# Patient Record
Sex: Male | Born: 1945 | Race: White | Hispanic: No | Smoking: Former smoker
Health system: Southern US, Community
[De-identification: ages and names within clinical notes are randomized; demographics above are authoritative.]

## PROBLEM LIST (undated history)

## (undated) DIAGNOSIS — R351 Nocturia: Secondary | ICD-10-CM

## (undated) DIAGNOSIS — L409 Psoriasis, unspecified: Secondary | ICD-10-CM

## (undated) DIAGNOSIS — F1010 Alcohol abuse, uncomplicated: Secondary | ICD-10-CM

## (undated) DIAGNOSIS — Z860100 Personal history of colon polyps, unspecified: Secondary | ICD-10-CM

## (undated) DIAGNOSIS — G4700 Insomnia, unspecified: Secondary | ICD-10-CM

## (undated) DIAGNOSIS — C4491 Basal cell carcinoma of skin, unspecified: Secondary | ICD-10-CM

## (undated) DIAGNOSIS — K760 Fatty (change of) liver, not elsewhere classified: Secondary | ICD-10-CM

## (undated) DIAGNOSIS — K8020 Calculus of gallbladder without cholecystitis without obstruction: Secondary | ICD-10-CM

## (undated) DIAGNOSIS — F329 Major depressive disorder, single episode, unspecified: Secondary | ICD-10-CM

## (undated) DIAGNOSIS — F32A Depression, unspecified: Secondary | ICD-10-CM

## (undated) DIAGNOSIS — G4730 Sleep apnea, unspecified: Secondary | ICD-10-CM

## (undated) DIAGNOSIS — G259 Extrapyramidal and movement disorder, unspecified: Secondary | ICD-10-CM

## (undated) DIAGNOSIS — F419 Anxiety disorder, unspecified: Secondary | ICD-10-CM

## (undated) HISTORY — PX: COLONOSCOPY: SHX174

## (undated) HISTORY — DX: Fatty (change of) liver, not elsewhere classified: K76.0

## (undated) HISTORY — DX: Personal history of colonic polyps: Z86.010

## (undated) HISTORY — DX: Anxiety disorder, unspecified: F41.9

## (undated) HISTORY — PX: OTHER SURGICAL HISTORY: SHX169

## (undated) HISTORY — PX: WISDOM TOOTH EXTRACTION: SHX21

## (undated) HISTORY — DX: Basal cell carcinoma of skin, unspecified: C44.91

## (undated) HISTORY — DX: Alcohol abuse, uncomplicated: F10.10

## (undated) HISTORY — DX: Extrapyramidal and movement disorder, unspecified: G25.9

## (undated) HISTORY — DX: Personal history of colon polyps, unspecified: Z86.0100

---

## 2003-09-18 ENCOUNTER — Ambulatory Visit (HOSPITAL_COMMUNITY): Admission: RE | Admit: 2003-09-18 | Discharge: 2003-09-18 | Payer: Self-pay | Admitting: *Deleted

## 2003-09-18 ENCOUNTER — Encounter (INDEPENDENT_AMBULATORY_CARE_PROVIDER_SITE_OTHER): Payer: Self-pay | Admitting: Specialist

## 2007-09-13 ENCOUNTER — Other Ambulatory Visit (HOSPITAL_COMMUNITY): Admission: RE | Admit: 2007-09-13 | Discharge: 2007-12-12 | Payer: Self-pay | Admitting: Psychiatry

## 2007-09-13 ENCOUNTER — Ambulatory Visit: Payer: Self-pay | Admitting: Psychiatry

## 2011-02-27 NOTE — Op Note (Signed)
[name], Dennis Woodard NO.:  [id number]   MEDICAL RECORD NO.:  [id number]                   PATIENT TYPE:  AMB   LOCATION:  ENDO                                 FACILITY:  [facility]   PHYSICIAN:  Georgiana Spinner, M.D.                 DATE OF BIRTH:  1946/02/11   DATE OF PROCEDURE:  09/18/2003  DATE OF DISCHARGE:                                 OPERATIVE REPORT   PROCEDURE:  Colonoscopy.   INDICATIONS:  Colon polyp.   ANESTHESIA:  Demerol 100, Versed 10 mg.   PROCEDURE:  With the patient mildly sedated in the left lateral decubitus  position, the Olympus videoscopic colonoscope was inserted in the rectum and  passed under direct vision into the cecum, identified by ileocecal valve and  base of the cecum, both of which were photographed.  From this point, the  colonoscope was slowly withdrawn, taking several antral views of the colonic  mucosa, stopping only at 25 cm from the anal verge, at which point a small  protuberance, which could have been a polyp or could have been a lipoma, was  photographed and biopsied and removed.  I favor lipoma since there was some  whitish material exuded after we biopsied it, indicating it was probably  fatty material.  The endoscope was then withdrawn all the way to the rectum  which appeared normal on direct and retroflexed view.  The endoscope was  straightened and withdrawn.  The patient's vital signs and pulse oximeter  remained stable.  The patient tolerated the procedure well without apparent  complications.   FINDINGS:  Question of polyp at 25 cm.  Question this being a lipoma.   PLAN:  Await biopsy report.  Patient will call me for results.                                               Georgiana Spinner, M.D.    GMO/MEDQ  D:  09/18/2003  T:  09/18/2003  Job:  [id number]

## 2012-05-06 ENCOUNTER — Other Ambulatory Visit: Payer: Self-pay | Admitting: Dermatology

## 2012-08-04 ENCOUNTER — Emergency Department (HOSPITAL_COMMUNITY)
Admission: EM | Admit: 2012-08-04 | Discharge: 2012-08-04 | Disposition: A | Discharge status: Home / Self Care | Payer: BC Managed Care – PPO | Attending: Emergency Medicine | Admitting: Emergency Medicine

## 2012-08-04 ENCOUNTER — Encounter (HOSPITAL_COMMUNITY): Payer: Self-pay | Admitting: Emergency Medicine

## 2012-08-04 ENCOUNTER — Emergency Department (HOSPITAL_COMMUNITY): Payer: BC Managed Care – PPO

## 2012-08-04 DIAGNOSIS — K5289 Other specified noninfective gastroenteritis and colitis: Secondary | ICD-10-CM | POA: Insufficient documentation

## 2012-08-04 DIAGNOSIS — K529 Noninfective gastroenteritis and colitis, unspecified: Secondary | ICD-10-CM

## 2012-08-04 DIAGNOSIS — R109 Unspecified abdominal pain: Secondary | ICD-10-CM

## 2012-08-04 DIAGNOSIS — F329 Major depressive disorder, single episode, unspecified: Secondary | ICD-10-CM | POA: Insufficient documentation

## 2012-08-04 DIAGNOSIS — F3289 Other specified depressive episodes: Secondary | ICD-10-CM | POA: Insufficient documentation

## 2012-08-04 DIAGNOSIS — Z79899 Other long term (current) drug therapy: Secondary | ICD-10-CM | POA: Insufficient documentation

## 2012-08-04 DIAGNOSIS — R1011 Right upper quadrant pain: Secondary | ICD-10-CM | POA: Insufficient documentation

## 2012-08-04 HISTORY — DX: Major depressive disorder, single episode, unspecified: F32.9

## 2012-08-04 HISTORY — DX: Depression, unspecified: F32.A

## 2012-08-04 LAB — COMPREHENSIVE METABOLIC PANEL
ALT: 24 U/L (ref 0–53)
AST: 20 U/L (ref 0–37)
Albumin: 3.6 g/dL (ref 3.5–5.2)
Alkaline Phosphatase: 91 U/L (ref 39–117)
Potassium: 3.7 mEq/L (ref 3.5–5.1)
Sodium: 137 mEq/L (ref 135–145)
Total Protein: 7.2 g/dL (ref 6.0–8.3)

## 2012-08-04 LAB — URINALYSIS, ROUTINE W REFLEX MICROSCOPIC
Bilirubin Urine: NEGATIVE
Hgb urine dipstick: NEGATIVE
Protein, ur: NEGATIVE mg/dL
Urobilinogen, UA: 0.2 mg/dL (ref 0.0–1.0)

## 2012-08-04 LAB — CBC WITH DIFFERENTIAL/PLATELET
Basophils Relative: 0 % (ref 0–1)
Eosinophils Absolute: 0.2 10*3/uL (ref 0.0–0.7)
MCH: 30.4 pg (ref 26.0–34.0)
MCHC: 34.4 g/dL (ref 30.0–36.0)
Neutrophils Relative %: 84 % — ABNORMAL HIGH (ref 43–77)
Platelets: 203 10*3/uL (ref 150–400)
RBC: 4.27 MIL/uL (ref 4.22–5.81)

## 2012-08-04 MED ORDER — METRONIDAZOLE IN NACL 5-0.79 MG/ML-% IV SOLN
500.0000 mg | Freq: Once | INTRAVENOUS | Status: AC
  Administered 2012-08-04: 500 mg via INTRAVENOUS
  Filled 2012-08-04: qty 100

## 2012-08-04 MED ORDER — SODIUM CHLORIDE 0.9 % IV BOLUS (SEPSIS)
500.0000 mL | Freq: Once | INTRAVENOUS | Status: AC
  Administered 2012-08-04: 500 mL via INTRAVENOUS

## 2012-08-04 MED ORDER — IOHEXOL 300 MG/ML  SOLN: 20.0000 mL | INTRAMUSCULAR | Status: AC

## 2012-08-04 MED ORDER — METRONIDAZOLE 500 MG PO TABS: 500.0000 mg | ORAL_TABLET | Freq: Two times a day (BID) | ORAL | Status: DC

## 2012-08-04 MED ORDER — HYDROMORPHONE HCL PF 1 MG/ML IJ SOLN
0.5000 mg | Freq: Once | INTRAMUSCULAR | Status: AC
  Administered 2012-08-04: 0.5 mg via INTRAVENOUS
  Filled 2012-08-04: qty 1

## 2012-08-04 MED ORDER — HYDROCODONE-ACETAMINOPHEN 5-500 MG PO TABS: 1.0000 | ORAL_TABLET | Freq: Four times a day (QID) | ORAL | Status: DC | PRN

## 2012-08-04 MED ORDER — CIPROFLOXACIN IN D5W 400 MG/200ML IV SOLN
400.0000 mg | Freq: Once | INTRAVENOUS | Status: AC
  Administered 2012-08-04: 400 mg via INTRAVENOUS
  Filled 2012-08-04: qty 200

## 2012-08-04 MED ORDER — IOHEXOL 300 MG/ML  SOLN
80.0000 mL | Freq: Once | INTRAMUSCULAR | Status: AC | PRN
  Administered 2012-08-04: 80 mL via INTRAVENOUS

## 2012-08-04 MED ORDER — ONDANSETRON HCL 4 MG/2ML IJ SOLN
4.0000 mg | Freq: Once | INTRAMUSCULAR | Status: AC
  Administered 2012-08-04: 4 mg via INTRAVENOUS
  Filled 2012-08-04: qty 2

## 2012-08-04 MED ORDER — SODIUM CHLORIDE 0.9 % IV SOLN
INTRAVENOUS | Status: DC
  Administered 2012-08-04: 09:00:00 via INTRAVENOUS

## 2012-08-04 MED ORDER — HYDROMORPHONE HCL PF 1 MG/ML IJ SOLN
1.0000 mg | Freq: Once | INTRAMUSCULAR | Status: AC
  Administered 2012-08-04: 1 mg via INTRAVENOUS
  Filled 2012-08-04: qty 1

## 2012-08-04 MED ORDER — CIPROFLOXACIN HCL 500 MG PO TABS: 500.0000 mg | ORAL_TABLET | Freq: Two times a day (BID) | ORAL | Status: DC

## 2012-08-04 NOTE — ED Provider Notes (Signed)
Assumed pt care in CDU.  Pt presents with abd pain, CT scan ordered for further evaluation.  CT shows evidence of colitis in ascending colon.  Pt currently in NAD, VSS, afebrile.  On examination he appeared in good health and spirits. Vital signs as documented. Skin warm and dry and without overt rashes. Neck without JVD. Lungs clear. Heart exam notable for regular rhythm, normal sounds and absence of murmurs, rubs or gallops. Abdomen moderately tender to RLQ without guarding or rebound tenderness. No evidence of organomegaly, masses, or abdominal aortic enlargement. Extremities nonedematous. Will give abx here and will d/c with cipro/flagyl and f/u with his PCP, Dr. Ricki Miller  3:09 PM Pt remains comfortable, continue to receive IV abx.  VSS, afebrile.  Will d/c with cipro/flagyl once IV is complete.  Report given to oncoming PA who will d/c pt once abx is finished.  BP 119/66  Pulse 63  Temp 97.9 F (36.6 C) (Oral)  Resp 16  SpO2 95%  I have reviewed nursing notes and vital signs. I personally reviewed the imaging tests through PACS system  I reviewed available ER/hospitalization records thought the EMR  Results for orders placed during the hospital encounter of 08/04/12  CBC WITH DIFFERENTIAL      Component Value Range   WBC 10.9 (*) 4.0 - 10.5 K/uL   RBC 4.27  4.22 - 5.81 MIL/uL   Hemoglobin 13.0  13.0 - 17.0 g/dL   HCT 16.1 (*) 09.6 - 04.5 %   MCV 88.5  78.0 - 100.0 fL   MCH 30.4  26.0 - 34.0 pg   MCHC 34.4  30.0 - 36.0 g/dL   RDW 40.9  81.1 - 91.4 %   Platelets 203  150 - 400 K/uL   Neutrophils Relative 84 (*) 43 - 77 %   Neutro Abs 9.1 (*) 1.7 - 7.7 K/uL   Lymphocytes Relative 8 (*) 12 - 46 %   Lymphs Abs 0.9  0.7 - 4.0 K/uL   Monocytes Relative 7  3 - 12 %   Monocytes Absolute 0.8  0.1 - 1.0 K/uL   Eosinophils Relative 1  0 - 5 %   Eosinophils Absolute 0.2  0.0 - 0.7 K/uL   Basophils Relative 0  0 - 1 %   Basophils Absolute 0.0  0.0 - 0.1 K/uL  COMPREHENSIVE METABOLIC PANEL      Component Value Range   Sodium 137  135 - 145 mEq/L   Potassium 3.7  3.5 - 5.1 mEq/L   Chloride 100  96 - 112 mEq/L   CO2 26  19 - 32 mEq/L   Glucose, Bld 124 (*) 70 - 99 mg/dL   BUN 7  6 - 23 mg/dL   Creatinine, Ser 7.82  0.50 - 1.35 mg/dL   Calcium 9.0  8.4 - 95.6 mg/dL   Total Protein 7.2  6.0 - 8.3 g/dL   Albumin 3.6  3.5 - 5.2 g/dL   AST 20  0 - 37 U/L   ALT 24  0 - 53 U/L   Alkaline Phosphatase 91  39 - 117 U/L   Total Bilirubin 0.3  0.3 - 1.2 mg/dL   GFR calc non Af Amer >90  >90 mL/min   GFR calc Af Amer >90  >90 mL/min  LIPASE, BLOOD      Component Value Range   Lipase 29  11 - 59 U/L  URINALYSIS, ROUTINE W REFLEX MICROSCOPIC      Component Value Range   Color,  Urine YELLOW  YELLOW   APPearance CLEAR  CLEAR   Specific Gravity, Urine 1.021  1.005 - 1.030   pH 5.0  5.0 - 8.0   Glucose, UA NEGATIVE  NEGATIVE mg/dL   Hgb urine dipstick NEGATIVE  NEGATIVE   Bilirubin Urine NEGATIVE  NEGATIVE   Ketones, ur NEGATIVE  NEGATIVE mg/dL   Protein, ur NEGATIVE  NEGATIVE mg/dL   Urobilinogen, UA 0.2  0.0 - 1.0 mg/dL   Nitrite NEGATIVE  NEGATIVE   Leukocytes, UA NEGATIVE  NEGATIVE   Ct Abdomen Pelvis W Contrast  08/04/2012  *RADIOLOGY REPORT*  Clinical Data: Right-sided abdominal pain for several weeks.  No fever or chills.  CT ABDOMEN AND PELVIS WITH CONTRAST  Technique:  Multidetector CT imaging of the abdomen and pelvis was performed following the standard protocol during bolus administration of intravenous contrast.  Contrast: 80mL OMNIPAQUE IOHEXOL 300 MG/ML  SOLN 80 ml  Comparison: None.  Findings: The lung bases are clear except for atelectatic changes at the right base.  There is no pleural effusion.  There is no pericardial effusion.  There is mild fatty infiltration of the liver without focal changes.  The gallbladder and the biliary tree appear normal.  The spleen and pancreas are normal.  The aorta and inferior vena cava are normal.  There is no evidence of  lymphadenopathy, free air, fluid collections or free fluid.  The adrenal glands and kidneys and retroperitoneum are normal. There are no stones or masses.  Stomach, duodenum and small bowel are normal.  The terminal ileum and the appendix are normal.  The cecum is normal.  The lumen of the ascending colon is collapsed but there is a suggestion of some injection into the mesenteric fat surrounding the ascending colon from about the upper margin of the cecum to the hepatic flexure.  Some of this strand-like density extends across to the duodenum which is otherwise normal.  The remainder of the colon is normal.  The prostate is slightly prominent.  The urinary bladder is normal. The pelvic sidewalls are normal.  There are no inguinal, abdominal wall or musculoskeletal abnormalities.  Bone windows show no osteolytic or osteoblastic bone disease.  IMPRESSION: Colitis of the ascending colon.  No additional acute pathologic findings.  Fatty liver.   Original Report Authenticated By: Mervin Hack, M.D.       Fayrene Helper, PA-C 08/04/12 1527

## 2012-08-04 NOTE — ED Notes (Signed)
MD at bedside. Laveda Norman)

## 2012-08-04 NOTE — ED Notes (Signed)
PT. REPORTS RIGHT ABDOMINAL PAIN FOR SEVERAL WEEKS , WORSE THIS MORNING WITH DIZZINESS , DENIES NAUSEA,VOMITTING OR DIARRHEA, NO FEVER OR CHILLS.

## 2012-08-04 NOTE — ED Provider Notes (Signed)
History     CSN: [id number]  Arrival date & time 08/04/12  0620   First MD Initiated Contact with Patient 08/04/12 [phone]      Chief Complaint  Patient presents with  . Abdominal Pain    (Consider location/radiation/quality/duration/timing/severity/associated sxs/prior treatment) HPI Comments: Patients with no significant past medical history, normal colonoscopy 9 years ago -- presents with complaint of intermittent right-sided sharp stabbing abdominal pain for the past 3-4 weeks. Pain will radiate to his right middle back. Patient states that the pain was worse this morning. No associated fevers, shortness of breath, chest pain, nausea, vomiting, diarrhea, urinary symptoms. No treatments prior to arrival. Patient states that he was a heavy drinker for approximately 14 years, he has been sober since 61. No history of abdominal surgeries. No tobacco. Patient has recently been seen by his primary care physician and had blood tests done. He states all appeared normal except for elevated liver function. He has not had any imaging for this problem. Onset acute. Course is gradually worsening. Palpation makes the pain worse. Nothing makes it better.  Patient is a 66 y.o. male presenting with abdominal pain. The history is provided by the patient.  Abdominal Pain The primary symptoms of the illness include abdominal pain. The primary symptoms of the illness do not include fever, shortness of breath, nausea, vomiting, diarrhea, hematochezia or dysuria. The current episode started 3 to 5 hours ago. The onset of the illness was gradual. The problem has not changed since onset. Significant associated medical issues do not include PUD, gallstones, liver disease or diverticulitis.    Past Medical History  Diagnosis Date  . Depression     History reviewed. No pertinent past surgical history.  No family history on file.  History  Substance Use Topics  . Smoking status: Never Smoker   . Smokeless  tobacco: Not on file  . Alcohol Use: No      Review of Systems  Constitutional: Negative for fever, appetite change and unexpected weight change.  HENT: Negative for sore throat and rhinorrhea.   Eyes: Negative for redness.  Respiratory: Negative for cough and shortness of breath.   Cardiovascular: Negative for chest pain.  Gastrointestinal: Positive for abdominal pain. Negative for nausea, vomiting, diarrhea, blood in stool and hematochezia.  Genitourinary: Negative for dysuria.  Musculoskeletal: Negative for myalgias.  Skin: Negative for rash.  Neurological: Negative for headaches.    Allergies  Review of patient's allergies indicates no known allergies.  Home Medications   Current Outpatient Rx  Name Route Sig Dispense Refill  . BUPROPION HCL ER (XL) 150 MG PO TB24 Oral Take 450 mg by mouth daily.    Marland Kitchen CLONAZEPAM 2 MG PO TABS Oral Take 2 mg by mouth 3 (three) times daily. Facial twitch Scheduled doses    . ESCITALOPRAM OXALATE 10 MG PO TABS Oral Take 10 mg by mouth daily.    . ALEVE PO Oral Take 1 tablet by mouth every 12 (twelve) hours as needed. Chest wall/ rib pain    . TRAZODONE HCL 50 MG PO TABS Oral Take 50-200 mg by mouth at bedtime as needed. For sleep Usually takes 2 tabs, can take up to 4 tabs if needed    . CIPROFLOXACIN HCL 500 MG PO TABS Oral Take 1 tablet (500 mg total) by mouth 2 (two) times daily. 28 tablet 0  . HYDROCODONE-ACETAMINOPHEN 5-500 MG PO TABS Oral Take 1-2 tablets by mouth every 6 (six) hours as needed for pain. 15 tablet  0  . METRONIDAZOLE 500 MG PO TABS Oral Take 1 tablet (500 mg total) by mouth 2 (two) times daily. 28 tablet 0    BP 191/83  Pulse 63  Temp 99.1 F (37.3 C) (Oral)  Resp 16  SpO2 98%  Physical Exam  Nursing note and vitals reviewed. Constitutional: He appears well-developed and well-nourished.  HENT:  Head: Normocephalic and atraumatic.  Eyes: Conjunctivae normal are normal. Right eye exhibits no discharge. Left eye  exhibits no discharge.  Neck: Normal range of motion. Neck supple.  Cardiovascular: Normal rate, regular rhythm and normal heart sounds.   Pulmonary/Chest: Effort normal and breath sounds normal.  Abdominal: Soft. He exhibits no distension, no pulsatile midline mass and no mass. Bowel sounds are decreased. There is no tenderness (neg Rovsings sign). There is no rebound and no guarding. No hernia.    Neurological: He is alert.  Skin: Skin is warm and dry.  Psychiatric: He has a normal mood and affect.    ED Course  Procedures (including critical care time)  Labs Reviewed  CBC WITH DIFFERENTIAL - Abnormal; Notable for the following:    WBC 10.9 (*)     HCT 37.8 (*)     Neutrophils Relative 84 (*)     Neutro Abs 9.1 (*)     Lymphocytes Relative 8 (*)     All other components within normal limits  COMPREHENSIVE METABOLIC PANEL - Abnormal; Notable for the following:    Glucose, Bld 124 (*)     All other components within normal limits  LIPASE, BLOOD  URINALYSIS, ROUTINE W REFLEX MICROSCOPIC   Ct Abdomen Pelvis W Contrast  08/04/2012  *RADIOLOGY REPORT*  Clinical Data: Right-sided abdominal pain for several weeks.  No fever or chills.  CT ABDOMEN AND PELVIS WITH CONTRAST  Technique:  Multidetector CT imaging of the abdomen and pelvis was performed following the standard protocol during bolus administration of intravenous contrast.  Contrast: 80mL OMNIPAQUE IOHEXOL 300 MG/ML  SOLN 80 ml  Comparison: None.  Findings: The lung bases are clear except for atelectatic changes at the right base.  There is no pleural effusion.  There is no pericardial effusion.  There is mild fatty infiltration of the liver without focal changes.  The gallbladder and the biliary tree appear normal.  The spleen and pancreas are normal.  The aorta and inferior vena cava are normal.  There is no evidence of lymphadenopathy, free air, fluid collections or free fluid.  The adrenal glands and kidneys and retroperitoneum  are normal. There are no stones or masses.  Stomach, duodenum and small bowel are normal.  The terminal ileum and the appendix are normal.  The cecum is normal.  The lumen of the ascending colon is collapsed but there is a suggestion of some injection into the mesenteric fat surrounding the ascending colon from about the upper margin of the cecum to the hepatic flexure.  Some of this strand-like density extends across to the duodenum which is otherwise normal.  The remainder of the colon is normal.  The prostate is slightly prominent.  The urinary bladder is normal. The pelvic sidewalls are normal.  There are no inguinal, abdominal wall or musculoskeletal abnormalities.  Bone windows show no osteolytic or osteoblastic bone disease.  IMPRESSION: Colitis of the ascending colon.  No additional acute pathologic findings.  Fatty liver.   Original Report Authenticated By: Mervin Hack, M.D.      1. Abdominal pain   2. Colitis  6:49 AM Patient seen and examined. Work-up initiated. Medications ordered.   Vital signs reviewed and are as follows: Filed Vitals:   08/04/12 0623  BP: 191/83  Pulse: 63  Temp: 99.1 F (37.3 C)  Resp: 16   8:13 AM Re-exam unchanged. BP improved from earlier. Pain from 10/10 to 5/10. Will move to CDU pending CT abd/pelvis. Care d/w Ardelle Park who will follow.   Plan: dispo per CT. Pain is lower than what would be expected for GB etiology however this remains on ddx.   BP 151/68  Pulse 63  Temp 99.1 F (37.3 C) (Oral)  Resp 17  SpO2 97%  MDM  CDU pending CT. DDx: colitis, appendicitis, nephrolithiasis, cholelithiasis        Renne Crigler, [place] 08/04/12 1644

## 2012-08-04 NOTE — ED Provider Notes (Signed)
Medical screening examination/treatment/procedure(s) were performed by non-physician practitioner and as supervising physician I was immediately available for consultation/collaboration.  Marwan T Powers, MD 08/04/12 1721 

## 2012-08-04 NOTE — ED Notes (Signed)
Pt reports that liver enzymes were elevated in blood labs drawn by PCP

## 2012-08-04 NOTE — ED Provider Notes (Signed)
Medical screening examination/treatment/procedure(s) were conducted as a shared visit with non-physician practitioner(s) and myself.  I personally evaluated the patient during the encounter  Intermittent RUQ pain for the past month. No vomiting or change in bowel habits.  Pain radiates to back. Previous alcoholic.  No neuro or pulse deficits. No pulsatile mass.  Glynn Octave, MD 08/04/12 [phone]

## 2012-08-04 NOTE — ED Notes (Addendum)
Drinking oral contrast for CT scan. No c/o nausea presently

## 2012-08-04 NOTE — ED Notes (Signed)
Report received, assumed care.  

## 2012-08-17 ENCOUNTER — Encounter: Payer: Self-pay | Admitting: Gastroenterology

## 2012-09-14 ENCOUNTER — Telehealth: Payer: Self-pay | Admitting: Gastroenterology

## 2012-09-14 ENCOUNTER — Ambulatory Visit (INDEPENDENT_AMBULATORY_CARE_PROVIDER_SITE_OTHER): Payer: BC Managed Care – PPO | Admitting: Gastroenterology

## 2012-09-14 ENCOUNTER — Encounter: Payer: Self-pay | Admitting: Gastroenterology

## 2012-09-14 VITALS — BP 120/68 | HR 68 | Ht 67.0 in | Wt 196.0 lb

## 2012-09-14 DIAGNOSIS — R933 Abnormal findings on diagnostic imaging of other parts of digestive tract: Secondary | ICD-10-CM

## 2012-09-14 MED ORDER — PEG-KCL-NACL-NASULF-NA ASC-C 100 G PO SOLR: 1.0000 | Freq: Once | ORAL | Status: DC

## 2012-09-14 NOTE — Telephone Encounter (Signed)
Left message on machine to call back  

## 2012-09-14 NOTE — Patient Instructions (Addendum)
You will be set up for a colonoscopy (LEC, moderate sedation) for recent abd pains, abnormal colon on CT. Before scheduling we will have our insurance expert to determine your out of pocket expense.

## 2012-09-14 NOTE — Telephone Encounter (Signed)
Pt questions were answered and he will call back with any further problems or concerns

## 2012-09-14 NOTE — Progress Notes (Signed)
HPI: This is a    very pleasant  66 year old man whom I am meeting for the first time today.  Was having right abd pains.  Intermittent.  Woke him up at night.  Crampy like pain, no associated nausea or vomiting.   Would go away after 4 hours.  No diarrhea until after he left the hospital.  Very loose stools for 4-5 days.  For 2 months, no problems with bowels, bleed.  CT scan abdomen and pelvis with IV and oral contrast 2 months ago suggested ascending colon inflammation. Labs at the time showed he was not anemic, cmet was also normal. Was put on abx.     Review of systems: Pertinent positive and negative review of systems were noted in the above HPI section. Complete review of systems was performed and was otherwise normal.    Past Medical History  Diagnosis Date  . Depression   . Colitis   . Fatty liver   . History of colon polyps   . Alcohol abuse   . Anxiety   . Skin cancer, basal cell     Past Surgical History  Procedure Date  . Wisdom tooth extraction     Current Outpatient Prescriptions  Medication Sig Dispense Refill  . buPROPion (WELLBUTRIN XL) 150 MG 24 hr tablet Take 150 mg by mouth daily.       . clonazePAM (KLONOPIN) 2 MG tablet Take 2 mg by mouth 3 (three) times daily. Facial twitch Scheduled doses      . escitalopram (LEXAPRO) 10 MG tablet Take 10 mg by mouth daily.      . Naproxen Sodium (ALEVE PO) Take 1 tablet by mouth every 12 (twelve) hours as needed. Chest wall/ rib pain      . traZODone (DESYREL) 50 MG tablet Take 50-200 mg by mouth at bedtime as needed. For sleep Usually takes 2 tabs, can take up to 4 tabs if needed        Allergies as of 09/14/2012  . (No Known Allergies)    History reviewed. No pertinent family history.  History   Social History  . Marital Status: Single    Spouse Name: N/A    Number of Children: 4  . Years of Education: N/A   Occupational History  . Bus Driver [employer]   Social History Main  Topics  . Smoking status: Never Smoker   . Smokeless tobacco: Never Used  . Alcohol Use: No  . Drug Use: No  . Sexually Active: Not on file   Other Topics Concern  . Not on file   Social History Narrative  . No narrative on file       Physical Exam: BP 120/68  Pulse 68  Ht 5\' 7"  (1.702 m)  Wt 196 lb (88.905 kg)  BMI 30.70 kg/m2 Constitutional: generally well-appearing Psychiatric: alert and oriented x3 Eyes: extraocular movements intact Mouth: oral pharynx moist, no lesions Neck: supple no lymphadenopathy Cardiovascular: heart regular rate and rhythm Lungs: clear to auscultation bilaterally Abdomen: soft, nontender, nondistended, no obvious ascites, no peritoneal signs, normal bowel sounds Extremities: no lower extremity edema bilaterally Skin: no lesions on visible extremities    Assessment and plan: 66 y.o. male with  recent right-sided abdominal pain, inflammation in a descending colon on CT scan, symptoms have completely improved  I did not mention above but he did have a colonoscopy December 2004 with Dr. Sabino Gasser, this found a small possible polyp at 25 cm. Biopsies showed no adenomatous  change.  His symptoms have completely improved since his October 2013 right-sided abdominal pain. I suspect he had some sort of infectious colitis. I suggested colonoscopy since it has been 9 years since his last one and he had that acute event recently. He is not sure he wants to go ahead with it however. He does understand that he would be recommended to have a routine screening colonoscopy within the next year.

## 2012-10-03 ENCOUNTER — Encounter: Payer: Self-pay | Admitting: Gastroenterology

## 2012-10-03 ENCOUNTER — Telehealth: Payer: Self-pay

## 2012-10-03 ENCOUNTER — Ambulatory Visit (AMBULATORY_SURGERY_CENTER): Payer: BC Managed Care – PPO | Admitting: Gastroenterology

## 2012-10-03 VITALS — BP 127/71 | HR 59 | Temp 98.2°F | Resp 15 | Ht 67.0 in | Wt 196.0 lb

## 2012-10-03 DIAGNOSIS — D126 Benign neoplasm of colon, unspecified: Secondary | ICD-10-CM

## 2012-10-03 DIAGNOSIS — R933 Abnormal findings on diagnostic imaging of other parts of digestive tract: Secondary | ICD-10-CM

## 2012-10-03 DIAGNOSIS — R1011 Right upper quadrant pain: Secondary | ICD-10-CM

## 2012-10-03 MED ORDER — SODIUM CHLORIDE 0.9 % IV SOLN: 500.0000 mL | INTRAVENOUS | Status: DC

## 2012-10-03 NOTE — Progress Notes (Signed)
Patient awake through out procedure yet comfortable. Vital signs stable, skin warn dry and pink

## 2012-10-03 NOTE — Progress Notes (Signed)
Patient did not experience any of the following events: a burn prior to discharge; a fall within the facility; wrong site/side/patient/procedure/implant event; or a hospital transfer or hospital admission upon discharge from the facility. (G8907) Patient did not have preoperative order for IV antibiotic SSI prophylaxis. (G8918)  

## 2012-10-03 NOTE — Telephone Encounter (Signed)
My office will arrange a RUQ ultrasound to check for gallstones (it  is unlikely that any of the polyps removed today have caused your  abdominal pains).

## 2012-10-03 NOTE — Op Note (Signed)
[facility] [address] [place], [address]   COLONOSCOPY PROCEDURE REPORT  PATIENT: Dennis, Woodard  MR#: [id number] BIRTHDATE: 12-02-1945 , 65  yrs. old GENDER: Male ENDOSCOPIST: Rachael Fee, MD REFERRED GN:[id number] Ricki Miller, M.D. PROCEDURE DATE:  10/03/2012 PROCEDURE:   Colonoscopy with snare polypectomy ASA CLASS:   Class II INDICATIONS:recent abd pain, CT scan suggested thickened ascending colon. MEDICATIONS: Fentanyl 75 mcg IV, Versed 7 mg IV, and These medications were titrated to patient response per physician's verbal order  DESCRIPTION OF PROCEDURE:   After the risks benefits and alternatives of the procedure were thoroughly explained, informed consent was obtained.  A digital rectal exam revealed no abnormalities of the rectum.   The LB [id number]  endoscope was introduced through the anus and advanced to the cecum, which was identified by both the appendix and ileocecal valve. No adverse events experienced.   The quality of the prep was good, using MoviPrep  The instrument was then slowly withdrawn as the colon was fully examined.  COLON FINDINGS: Nine polyps were found, removed and sent to pathology.  These were all sessile, located in cecum, ascending, hep flexure, transeverse segments.  They ranged in size from 4mm to 16mm.  Two of them (hep flexure, transverse colon) required snare/cautery.  The rest were all removed with cold snare.  One of the polyps at ascending segment, that was removed with cold snare bled more than is typical and so a single endoclip was placed at the site with immediate hemostasis.  The examination was otherwise normal.  Retroflexed views revealed no abnormalities. The time to cecum=2 minutes 46 seconds.  Withdrawal time=13 minutes 55 seconds. The scope was withdrawn and the procedure completed. COMPLICATIONS: There were no complications.  ENDOSCOPIC IMPRESSION: Nine polyps were found, removed and sent to  pathology. The examination was otherwise normal.  RECOMMENDATIONS: If the polyp(s) removed today are proven to be adenomatous (pre-cancerous) polyps, you will need a colonoscopy in 1 year given large number of polyps..  Otherwise you should continue to follow colorectal cancer screening guidelines for "routine risk" patients with a colonoscopy in 10 years.  You will receive a letter within 1-2 weeks with the results of your biopsy as well as final recommendations.  Please call my office if you have not received a letter after 3 weeks. My office will arrange a RUQ ultrasound to check for gallstones (it is unlikely that any of the polyps removed today have caused your abdominal pains).   eSigned:  Rachael Fee, MD 10/03/2012 2:33 PM      PATIENT NAME:  Dennis, Woodard MR#: [id number]

## 2012-10-03 NOTE — Patient Instructions (Addendum)
Discharge instructions given with verbal understanding. Handout on polyps given in sealed envelope. Resume previous medications. YOU HAD AN ENDOSCOPIC PROCEDURE TODAY AT THE [facility]: Refer to the procedure report that was given to you for any specific questions about what was found during the examination.  If the procedure report does not answer your questions, please call your gastroenterologist to clarify.  If you requested that your care partner not be given the details of your procedure findings, then the procedure report has been included in a sealed envelope for you to review at your convenience later.  YOU SHOULD EXPECT: Some feelings of bloating in the abdomen. Passage of more gas than usual.  Walking can help get rid of the air that was put into your GI tract during the procedure and reduce the bloating. If you had a lower endoscopy (such as a colonoscopy or flexible sigmoidoscopy) you may notice spotting of blood in your stool or on the toilet paper. If you underwent a bowel prep for your procedure, then you may not have a normal bowel movement for a few days.  DIET: Your first meal following the procedure should be a light meal and then it is ok to progress to your normal diet.  A half-sandwich or bowl of soup is an example of a good first meal.  Heavy or fried foods are harder to digest and may make you feel nauseous or bloated.  Likewise meals heavy in dairy and vegetables can cause extra gas to form and this can also increase the bloating.  Drink plenty of fluids but you should avoid alcoholic beverages for 24 hours.  ACTIVITY: Your care partner should take you home directly after the procedure.  You should plan to take it easy, moving slowly for the rest of the day.  You can resume normal activity the day after the procedure however you should NOT DRIVE or use heavy machinery for 24 hours (because of the sedation medicines used during the test).    SYMPTOMS TO REPORT  IMMEDIATELY: A gastroenterologist can be reached at any hour.  During normal business hours, 8:30 AM to 5:00 PM Monday through Friday, call [phone].  After hours and on weekends, please call the GI answering service at [phone] who will take a message and have the physician on call contact you.   Following lower endoscopy (colonoscopy or flexible sigmoidoscopy):  Excessive amounts of blood in the stool  Significant tenderness or worsening of abdominal pains  Swelling of the abdomen that is new, acute  Fever of 100F or higher  FOLLOW UP: If any biopsies were taken you will be contacted by phone or by letter within the next 1-3 weeks.  Call your gastroenterologist if you have not heard about the biopsies in 3 weeks.  Our staff will call the home number listed on your records the next business day following your procedure to check on you and address any questions or concerns that you may have at that time regarding the information given to you following your procedure. This is a courtesy call and so if there is no answer at the home number and we have not heard from you through the emergency physician on call, we will assume that you have returned to your regular daily activities without incident.  SIGNATURES/CONFIDENTIALITY: You and/or your care partner have signed paperwork which will be entered into your electronic medical record.  These signatures attest to the fact that that the information above on your After  Visit Summary has been reviewed and is understood.  Full responsibility of the confidentiality of this discharge information lies with you and/or your care-partner.

## 2012-10-04 ENCOUNTER — Telehealth: Payer: Self-pay | Admitting: *Deleted

## 2012-10-04 NOTE — Telephone Encounter (Signed)
Left message on number given in admitting yesterday. ewm 

## 2012-10-06 ENCOUNTER — Other Ambulatory Visit (HOSPITAL_COMMUNITY): Payer: BC Managed Care – PPO

## 2012-10-06 ENCOUNTER — Ambulatory Visit (HOSPITAL_COMMUNITY)
Admission: RE | Admit: 2012-10-06 | Discharge: 2012-10-06 | Disposition: A | Discharge status: Home / Self Care | Payer: BC Managed Care – PPO | Source: Ambulatory Visit | Attending: Gastroenterology | Admitting: Gastroenterology

## 2012-10-06 DIAGNOSIS — R1011 Right upper quadrant pain: Secondary | ICD-10-CM | POA: Insufficient documentation

## 2012-10-06 NOTE — Telephone Encounter (Signed)
Left message on machine to call back  

## 2012-10-06 NOTE — Telephone Encounter (Signed)
Pt given appt for today 130 pm [facility]

## 2012-10-07 ENCOUNTER — Other Ambulatory Visit (HOSPITAL_COMMUNITY): Payer: BC Managed Care – PPO

## 2012-10-07 ENCOUNTER — Other Ambulatory Visit: Payer: Self-pay

## 2012-10-07 ENCOUNTER — Telehealth: Payer: Self-pay

## 2012-10-07 DIAGNOSIS — K8020 Calculus of gallbladder without cholecystitis without obstruction: Secondary | ICD-10-CM

## 2012-10-07 MED ORDER — HYDROCODONE-ACETAMINOPHEN 5-500 MG PO TABS: 1.0000 | ORAL_TABLET | Freq: Four times a day (QID) | ORAL | Status: DC | PRN

## 2012-10-07 NOTE — Telephone Encounter (Signed)
Pt has been referred to [facility] for gallbladder surgery and would like something for pain until the appt  Please advise

## 2012-10-07 NOTE — Telephone Encounter (Signed)
Message copied by Donata Duff on Fri Oct 07, 2012  9:02 AM ------      Message from: Marnette Burgess      Created: Fri Oct 07, 2012  8:58 AM       Patient is scheduled to see Dr. Harriette Bouillon @ 2:20pm, arrive @ 1:50pm.  If you have any questions please call [phone].            Thank You,      Verdene Lennert. Yes Palmer Fahrner I did have a great Christmas thank you, I hope you did too.      ----- Message -----         From: Donata Duff, CMA         Sent: 10/07/2012   8:30 AM           To: Marnette Burgess            Pt needs appt to discuss having gallbladder removed Thank you hope your christmas was wonderful

## 2012-10-07 NOTE — Telephone Encounter (Signed)
vicodin 5:500, take 1-2 every 6 hours for pain PRN.  Disp 50, no refills.  Thanks

## 2012-10-07 NOTE — Telephone Encounter (Signed)
Blanca to notify pt of [facility] appt medication sent to the pharmacy and pt aware

## 2012-10-13 ENCOUNTER — Telehealth: Payer: Self-pay | Admitting: Gastroenterology

## 2012-10-13 NOTE — Telephone Encounter (Signed)
Pt aware results not yet available I will call as soon as reviewed

## 2012-10-15 ENCOUNTER — Encounter: Payer: Self-pay | Admitting: Gastroenterology

## 2012-10-17 ENCOUNTER — Ambulatory Visit (INDEPENDENT_AMBULATORY_CARE_PROVIDER_SITE_OTHER): Payer: Federal, State, Local not specified - PPO | Admitting: Surgery

## 2012-10-21 ENCOUNTER — Ambulatory Visit (INDEPENDENT_AMBULATORY_CARE_PROVIDER_SITE_OTHER): Payer: Federal, State, Local not specified - PPO | Admitting: Surgery

## 2012-10-21 ENCOUNTER — Encounter (INDEPENDENT_AMBULATORY_CARE_PROVIDER_SITE_OTHER): Payer: Self-pay | Admitting: Surgery

## 2012-10-21 ENCOUNTER — Ambulatory Visit (INDEPENDENT_AMBULATORY_CARE_PROVIDER_SITE_OTHER): Payer: BC Managed Care – PPO | Admitting: Surgery

## 2012-10-21 VITALS — BP 122/72 | HR 72 | Temp 98.0°F | Resp 16 | Ht 68.0 in | Wt 202.2 lb

## 2012-10-21 DIAGNOSIS — K8000 Calculus of gallbladder with acute cholecystitis without obstruction: Secondary | ICD-10-CM

## 2012-10-21 NOTE — Patient Instructions (Signed)

## 2012-10-21 NOTE — Progress Notes (Signed)
Patient ID: Dennis Deardorff Jr., male   DOB: 02/22/1946, 67 y.o.   MRN: [id number]  Chief Complaint  Patient presents with  . New Evaluation    eval GB    HPI Dennis Higbie Jr. is a 67 y.o. male.  Patient sent today at the request of Dr. Pang for right upper quadrant abdominal pain. He has had episodes of severe sharp right upper quadrant abdominal pain which radiates to his back since October of last year. He has had at least 3-4 severe attacks of pain. The pain lasts minutes to hours and has been to the emergency room one time for the pain. Ultrasound reveals a large gallstone and sludge in the gallbladder with a borderline thickened gallbladder wall. He cannot relate his symptoms to any food he has eaten recently. Nausea and vomiting noted as well. HPI  Past Medical History  Diagnosis Date  . Depression   . Colitis   . Fatty liver   . History of colon polyps   . Alcohol abuse   . Anxiety   . Skin cancer, basal cell     Past Surgical History  Procedure Date  . Wisdom tooth extraction     Family History  Problem Relation Age of Onset  . Colon cancer Neg Hx     Social History History  Substance Use Topics  . Smoking status: Former Smoker    Types: Cigarettes    Quit date: 10/12/1970  . Smokeless tobacco: Never Used  . Alcohol Use: No    No Known Allergies  Current Outpatient Prescriptions  Medication Sig Dispense Refill  . buPROPion (WELLBUTRIN XL) 150 MG 24 hr tablet Take 150 mg by mouth daily.       . clonazePAM (KLONOPIN) 2 MG tablet Take 2 mg by mouth 3 (three) times daily. Facial twitch Scheduled doses      . escitalopram (LEXAPRO) 10 MG tablet Take 10 mg by mouth daily.      . HYDROcodone-acetaminophen (VICODIN) 5-500 MG per tablet Take 1-2 tablets by mouth every 6 (six) hours as needed for pain.  50 tablet  0  . traZODone (DESYREL) 50 MG tablet Take 50-200 mg by mouth at bedtime as needed. For sleep Usually takes 2 tabs, can take up to 4 tabs if needed      .  Naproxen Sodium (ALEVE PO) Take 1 tablet by mouth every 12 (twelve) hours as needed. Chest wall/ rib pain        Review of Systems Review of Systems  Constitutional: Negative for fever, chills and unexpected weight change.  HENT: Negative for hearing loss, congestion, sore throat, trouble swallowing and voice change.   Eyes: Negative for visual disturbance.  Respiratory: Negative for cough and wheezing.   Cardiovascular: Negative for chest pain, palpitations and leg swelling.  Gastrointestinal: Negative for nausea, vomiting, abdominal pain, diarrhea, constipation, blood in stool, abdominal distention, anal bleeding and rectal pain.  Genitourinary: Negative for hematuria and difficulty urinating.  Musculoskeletal: Negative for arthralgias.  Skin: Negative for rash and wound.  Neurological: Negative for seizures, syncope, weakness and headaches.  Hematological: Negative for adenopathy. Does not bruise/bleed easily.  Psychiatric/Behavioral: Negative for confusion.    Blood pressure 122/72, pulse 72, temperature 98 F (36.7 C), temperature source Temporal, resp. rate 16, height 5' 8" (1.727 m), weight 202 lb 3.2 oz (91.717 kg).  Physical Exam Physical Exam  Constitutional: He is oriented to person, place, and time. He appears well-developed and well-nourished.  HENT:  Head: Normocephalic and   atraumatic.  Eyes: EOM are normal. Pupils are equal, round, and reactive to light.  Neck: Normal range of motion. Neck supple.  Cardiovascular: Normal rate and regular rhythm.   Pulmonary/Chest: Effort normal and breath sounds normal.  Abdominal: Soft. Bowel sounds are normal. He exhibits no distension. There is no tenderness. There is no rebound and no guarding.  Musculoskeletal: Normal range of motion.  Neurological: He is alert and oriented to person, place, and time.  Skin: Skin is warm.  Psychiatric: He has a normal mood and affect. His behavior is normal. Judgment and thought content normal.     Data Reviewed Clinical Data: abdominal pain, evaluate for gallstones  COMPLETE ABDOMINAL ULTRASOUND  Comparison: CT scan performed 08/04/2012  Findings:  Gallbladder: The gallbladder lumen is filled with sludge. There  is a 2.0 cm gallstone. Gallbladder wall is of borderline thickness  at about 3.5 mm. There is no sonographic Murphy Sign.  Common bile duct: Normal at under 5 mm  Liver: No focal lesion identified. Within normal limits in  parenchymal echogenicity.  IVC: Appears normal.  Pancreas: Only evaluated in limited detail but appears grossly  normal  Spleen: Measures  Right Kidney: Measures  Left Kidney: Measures  Abdominal aorta: No aneurysm identified.  IMPRESSION:  Abundant sludge within the gallbladder with a 2.0 cm gallstone and  borderline wall thickness. The findings can be seen with  cholecystitis, although there is no sonographic Murphy's sign.    Assessment    Gallstones / biliary colic/  Chronic inflammation of gallbladder    Plan    Laparoscopic cholecystectomy and cholangiogram.  The procedure has been discussed with the patient. Operative and non operative treatments have been discussed. Risks of surgery include bleeding, infection,  Common bile duct injury,  Injury to the stomach,liver, colon,small intestine, abdominal wall,  Diaphragm,  Major blood vessels,  And the need for an open procedure.  Other risks include worsening of medical problems, death,  DVT and pulmonary embolism, and cardiovascular events.   Medical options have also been discussed. The patient has been informed of long term expectations of surgery and non surgical options,  The patient agrees to proceed.         Gerardine Peltz A. 10/21/2012, 11:47 AM    

## 2012-11-04 ENCOUNTER — Encounter (HOSPITAL_COMMUNITY): Payer: Self-pay | Admitting: Pharmacy Technician

## 2012-11-09 ENCOUNTER — Inpatient Hospital Stay (HOSPITAL_COMMUNITY): Admission: RE | Admit: 2012-11-09 | Payer: BC Managed Care – PPO | Source: Ambulatory Visit

## 2012-11-09 NOTE — Pre-Procedure Instructions (Addendum)
Dennis Woodard.  11/09/2012   Your procedure is scheduled on:  Wed, Feb 5 @ 1:00 PM  Report to [facility] at 11:00 AM.  Call this number if you have problems the morning of surgery: [phone]   Remember:   Do not eat food or drink liquids after midnight.   Take these medicines the morning of surgery with A SIP OF WATER: Wellbutrin(Bupropion),Clonazepam(Klonopin),Escitalopram(Lexapro),and Pain Pill(if needed)               Stop taking your Aleve.No Aspirin,Fish Oil,Goody's,BC's,and herbal meds   Do not wear jewelry  Do not wear lotions, powders, or colognes.  Men may shave face and neck.  Do not bring valuables to the hospital.  Contacts, dentures or bridgework may not be worn into surgery.  Leave suitcase in the car. After surgery it may be brought to your room.  For patients admitted to the hospital, checkout time is 11:00 AM the day of  discharge.   Patients discharged the day of surgery will not be allowed to drive  home.    Special Instructions: Shower using CHG 2 nights before surgery and the night before surgery.  If you shower the day of surgery use CHG.  Use special wash - you have one bottle of CHG for all showers.  You should use approximately 1/3 of the bottle for each shower.   Please read over the following fact sheets that you were given: Pain Booklet, Coughing and Deep Breathing, MRSA Information and Surgical Site Infection Prevention

## 2012-11-10 ENCOUNTER — Telehealth (INDEPENDENT_AMBULATORY_CARE_PROVIDER_SITE_OTHER): Payer: Self-pay | Admitting: General Surgery

## 2012-11-10 ENCOUNTER — Encounter (HOSPITAL_COMMUNITY): Payer: Self-pay

## 2012-11-10 ENCOUNTER — Encounter (HOSPITAL_COMMUNITY)
Admission: RE | Admit: 2012-11-10 | Discharge: 2012-11-10 | Disposition: A | Discharge status: Home / Self Care | Payer: BC Managed Care – PPO | Source: Ambulatory Visit | Attending: Surgery | Admitting: Surgery

## 2012-11-10 ENCOUNTER — Telehealth: Payer: Self-pay | Admitting: Gastroenterology

## 2012-11-10 ENCOUNTER — Other Ambulatory Visit: Payer: Self-pay | Admitting: Gastroenterology

## 2012-11-10 ENCOUNTER — Ambulatory Visit (HOSPITAL_COMMUNITY)
Admission: RE | Admit: 2012-11-10 | Discharge: 2012-11-10 | Disposition: A | Discharge status: Home / Self Care | Payer: BC Managed Care – PPO | Source: Ambulatory Visit | Attending: Surgery | Admitting: Surgery

## 2012-11-10 DIAGNOSIS — Z01812 Encounter for preprocedural laboratory examination: Secondary | ICD-10-CM | POA: Insufficient documentation

## 2012-11-10 DIAGNOSIS — Z01818 Encounter for other preprocedural examination: Secondary | ICD-10-CM | POA: Insufficient documentation

## 2012-11-10 HISTORY — DX: Psoriasis, unspecified: L40.9

## 2012-11-10 HISTORY — DX: Nocturia: R35.1

## 2012-11-10 HISTORY — DX: Insomnia, unspecified: G47.00

## 2012-11-10 HISTORY — DX: Calculus of gallbladder without cholecystitis without obstruction: K80.20

## 2012-11-10 HISTORY — DX: Sleep apnea, unspecified: G47.30

## 2012-11-10 LAB — CBC WITH DIFFERENTIAL/PLATELET
Eosinophils Absolute: 0.2 10*3/uL (ref 0.0–0.7)
Eosinophils Relative: 2 % (ref 0–5)
MCHC: 34.7 g/dL (ref 30.0–36.0)
Monocytes Absolute: 0.7 10*3/uL (ref 0.1–1.0)
Monocytes Relative: 9 % (ref 3–12)
Neutro Abs: 6.1 10*3/uL (ref 1.7–7.7)
Neutrophils Relative %: 78 % — ABNORMAL HIGH (ref 43–77)
RDW: 13 % (ref 11.5–15.5)

## 2012-11-10 LAB — COMPREHENSIVE METABOLIC PANEL
BUN: 18 mg/dL (ref 6–23)
CO2: 22 mEq/L (ref 19–32)
Calcium: 8.8 mg/dL (ref 8.4–10.5)
GFR calc Af Amer: >90 mL/min (ref >90)
GFR calc non Af Amer: 87 mL/min — ABNORMAL LOW (ref >90)
Glucose, Bld: 143 mg/dL — ABNORMAL HIGH (ref 70–99)
Total Protein: 6.8 g/dL (ref 6.0–8.3)

## 2012-11-10 LAB — SURGICAL PCR SCREEN
MRSA, PCR: NEGATIVE
Staphylococcus aureus: NEGATIVE

## 2012-11-10 MED ORDER — CHLORHEXIDINE GLUCONATE 4 % EX LIQD: 1.0000 "application " | Freq: Once | CUTANEOUS | Status: DC

## 2012-11-10 NOTE — Progress Notes (Signed)
Sleep study done 4+yrs ago and pt states he doesn't use CPAP--to request report from Dr.Pang

## 2012-11-10 NOTE — Progress Notes (Addendum)
Pt doesn't have a cardiologist   Denies ever having an echo or heart cath  Stress test done 1-52yrs ago but not sure why it was done;states everything was normal  Medical MD is Dr.Pang  Denies EKG or CXR within past

## 2012-11-10 NOTE — Telephone Encounter (Signed)
Pt is having gallbladder surgery on WED and wants Dr Christella Hartigan to prescribe a stronger narcotic.  I advised the pt to call the surgeons office and have them prescribe something he has been seen by Dr Luisa Hart in the last couple of weeks  Pt agreed

## 2012-11-10 NOTE — Telephone Encounter (Signed)
Pt called for pain meds; he will run out of them before surgery in 5 days.  They came from Dr. Christella Hartigan, but his office told him to call surgeon.  Explained that the surgeon will not prescribe pain meds until after the surgery.  Pt states he has onset of pain about 3pm daily that lasts until about 11pm.  It is even then poorly managed by the Hydrocodone.  Suggested he closely monitor his diet to exclude all fats; remember to include fats used in cooking other than frying.  Also he can take Aleve to augment his Vicodin.  If all else fails, go to ER for evaluation.

## 2012-11-11 ENCOUNTER — Other Ambulatory Visit (HOSPITAL_COMMUNITY): Payer: BC Managed Care – PPO

## 2012-11-11 NOTE — Consult Note (Addendum)
Anesthesia Chart Review:  Patient is a 67 year old male scheduled for laparoscopic cholecystectomy on 11/16/12 by D.r Cornett.  History includes former smoker, ETOH abuse with no current use documented, fatty liver, anxiety, psoriasis, skin cancer, depression, OSA, insomnia.  PCP is Dr. Ricki Miller.  Preoperative labs noted.  AST/ALT, H/H WNL.  PLT clumps (previously 203K on 08/04/12).  Repeat CBC on arrival.    CXR on 11/10/12 showed linear subsegmental atelectasis or fibrotic changes are present. No pulmonary edema, pneumonia, or pleural effusion is evident.  Reportedly, he had a stress test within the past two years ordered by his PCP.  Records requested, but are still pending.  If records received by his PCP are unremarkable and follow-up labs are acceptable then would anticipate he could proceed as planned.  Shonna Chock, PA-C 11/11/12 1206  Addendum: 10/15/12 1700 Just received records from Dr. Ricki Miller.  Patient is scheduled for OR tomorrow.  He has no known CAD/MI/CHF history.  EKG on 05/29/10 showed SR, LAD/LAFB.  The only stress test sent was a negative exercise tolerance test from 12/19/03.  Sleep study from 02/13/04 showed moderate OSA.  I've asked his Short Stay nurse tomorrow to inquire if in fact he has had a more recent stress test (within the past three years) and request if available.  Otherwise no change in my initial note from 11/11/12.

## 2012-11-14 NOTE — Progress Notes (Signed)
Spoke with Eunice Blase at Dr Lynne Logan office and re-requested stress test, sleep study, EKG and last office visit notes.  She states she will get them to [place] ([place])

## 2012-11-15 MED ORDER — DEXTROSE 5 % IV SOLN
3.0000 g | INTRAVENOUS | Status: AC
  Administered 2012-11-16: 3 g via INTRAVENOUS
  Filled 2012-11-15: qty 3000

## 2012-11-15 NOTE — Progress Notes (Addendum)
Left voice mail for pt to arrive @ [facility] 10:30am.  Left message at phone [phone]

## 2012-11-16 ENCOUNTER — Ambulatory Visit (HOSPITAL_COMMUNITY)
Admission: RE | Admit: 2012-11-16 | Discharge: 2012-11-16 | Disposition: A | Discharge status: Home / Self Care | Payer: BC Managed Care – PPO | Source: Ambulatory Visit | Attending: Surgery | Admitting: Surgery

## 2012-11-16 ENCOUNTER — Encounter (HOSPITAL_COMMUNITY): Payer: Self-pay | Admitting: Vascular Surgery

## 2012-11-16 ENCOUNTER — Ambulatory Visit (HOSPITAL_COMMUNITY): Payer: BC Managed Care – PPO | Admitting: Vascular Surgery

## 2012-11-16 ENCOUNTER — Ambulatory Visit (HOSPITAL_COMMUNITY): Payer: BC Managed Care – PPO

## 2012-11-16 ENCOUNTER — Encounter (HOSPITAL_COMMUNITY): Payer: Self-pay

## 2012-11-16 ENCOUNTER — Encounter (HOSPITAL_COMMUNITY)
Admission: RE | Disposition: A | Discharge status: Home / Self Care | Payer: Self-pay | Source: Ambulatory Visit | Attending: Surgery

## 2012-11-16 DIAGNOSIS — K8066 Calculus of gallbladder and bile duct with acute and chronic cholecystitis without obstruction: Secondary | ICD-10-CM

## 2012-11-16 DIAGNOSIS — K811 Chronic cholecystitis: Secondary | ICD-10-CM

## 2012-11-16 DIAGNOSIS — K8010 Calculus of gallbladder with chronic cholecystitis without obstruction: Secondary | ICD-10-CM | POA: Insufficient documentation

## 2012-11-16 HISTORY — PX: CHOLECYSTECTOMY: SHX55

## 2012-11-16 LAB — CBC
HCT: 37.9 % — ABNORMAL LOW (ref 39.0–52.0)
Hemoglobin: 13.4 g/dL (ref 13.0–17.0)
MCHC: 35.4 g/dL (ref 30.0–36.0)
RDW: 12.9 % (ref 11.5–15.5)
WBC: 5.8 10*3/uL (ref 4.0–10.5)

## 2012-11-16 SURGERY — LAPAROSCOPIC CHOLECYSTECTOMY WITH INTRAOPERATIVE CHOLANGIOGRAM
Anesthesia: General | Site: Abdomen | Wound class: Contaminated

## 2012-11-16 MED ORDER — SODIUM CHLORIDE 0.9 % IJ SOLN: 3.0000 mL | INTRAMUSCULAR | Status: DC | PRN

## 2012-11-16 MED ORDER — ACETAMINOPHEN 650 MG RE SUPP: 650.0000 mg | RECTAL | Status: DC | PRN

## 2012-11-16 MED ORDER — LACTATED RINGERS IV SOLN
INTRAVENOUS | Status: DC
  Administered 2012-11-16: 12:00:00 via INTRAVENOUS

## 2012-11-16 MED ORDER — PROPOFOL 10 MG/ML IV BOLUS
INTRAVENOUS | Status: DC | PRN
  Administered 2012-11-16: 170 mg via INTRAVENOUS

## 2012-11-16 MED ORDER — SODIUM CHLORIDE 0.9 % IV SOLN: 250.0000 mL | INTRAVENOUS | Status: DC | PRN

## 2012-11-16 MED ORDER — OXYCODONE-ACETAMINOPHEN 10-325 MG PO TABS: 1.0000 | ORAL_TABLET | ORAL | Status: DC | PRN

## 2012-11-16 MED ORDER — SODIUM CHLORIDE 0.9 % IR SOLN
Status: DC | PRN
  Administered 2012-11-16 (×2): 2000 mL

## 2012-11-16 MED ORDER — HYDROMORPHONE HCL PF 1 MG/ML IJ SOLN
INTRAMUSCULAR | Status: AC
  Filled 2012-11-16: qty 1

## 2012-11-16 MED ORDER — OXYCODONE HCL 5 MG PO TABS
5.0000 mg | ORAL_TABLET | Freq: Once | ORAL | Status: DC | PRN
  Filled 2012-11-16: qty 2

## 2012-11-16 MED ORDER — ROCURONIUM BROMIDE 100 MG/10ML IV SOLN
INTRAVENOUS | Status: DC | PRN
  Administered 2012-11-16: 50 mg via INTRAVENOUS

## 2012-11-16 MED ORDER — BUPIVACAINE-EPINEPHRINE 0.25% -1:200000 IJ SOLN
INTRAMUSCULAR | Status: DC | PRN
  Administered 2012-11-16: 10 mL

## 2012-11-16 MED ORDER — MEPERIDINE HCL 25 MG/ML IJ SOLN: 6.2500 mg | INTRAMUSCULAR | Status: DC | PRN

## 2012-11-16 MED ORDER — LIDOCAINE HCL (CARDIAC) 20 MG/ML IV SOLN
INTRAVENOUS | Status: DC | PRN
  Administered 2012-11-16: 100 mg via INTRAVENOUS

## 2012-11-16 MED ORDER — PROMETHAZINE HCL 25 MG/ML IJ SOLN: 6.2500 mg | INTRAMUSCULAR | Status: DC | PRN

## 2012-11-16 MED ORDER — HEMOSTATIC AGENTS (NO CHARGE) OPTIME
TOPICAL | Status: DC | PRN
  Administered 2012-11-16: 2 via TOPICAL

## 2012-11-16 MED ORDER — GLYCOPYRROLATE 0.2 MG/ML IJ SOLN
INTRAMUSCULAR | Status: DC | PRN
  Administered 2012-11-16: 1 mg via INTRAVENOUS

## 2012-11-16 MED ORDER — SODIUM CHLORIDE 0.9 % IV SOLN
INTRAVENOUS | Status: DC | PRN
  Administered 2012-11-16: 13:00:00

## 2012-11-16 MED ORDER — VECURONIUM BROMIDE 10 MG IV SOLR
INTRAVENOUS | Status: DC | PRN
  Administered 2012-11-16: 1 mg via INTRAVENOUS

## 2012-11-16 MED ORDER — ONDANSETRON HCL 4 MG/2ML IJ SOLN: 4.0000 mg | Freq: Four times a day (QID) | INTRAMUSCULAR | Status: DC | PRN

## 2012-11-16 MED ORDER — MIDAZOLAM HCL 5 MG/5ML IJ SOLN
INTRAMUSCULAR | Status: DC | PRN
  Administered 2012-11-16: 2 mg via INTRAVENOUS

## 2012-11-16 MED ORDER — ONDANSETRON HCL 4 MG/2ML IJ SOLN
INTRAMUSCULAR | Status: DC | PRN
  Administered 2012-11-16: 4 mg via INTRAVENOUS

## 2012-11-16 MED ORDER — OXYCODONE HCL 5 MG PO TABS
5.0000 mg | ORAL_TABLET | ORAL | Status: DC | PRN
  Administered 2012-11-16: 5 mg via ORAL

## 2012-11-16 MED ORDER — NEOSTIGMINE METHYLSULFATE 1 MG/ML IJ SOLN
INTRAMUSCULAR | Status: DC | PRN
  Administered 2012-11-16: 5 mg via INTRAVENOUS

## 2012-11-16 MED ORDER — MORPHINE SULFATE 2 MG/ML IJ SOLN: 2.0000 mg | INTRAMUSCULAR | Status: DC | PRN

## 2012-11-16 MED ORDER — HYDROMORPHONE HCL PF 1 MG/ML IJ SOLN
0.2500 mg | INTRAMUSCULAR | Status: DC | PRN
  Administered 2012-11-16: 0.5 mg via INTRAVENOUS
  Administered 2012-11-16: 0.25 mg via INTRAVENOUS
  Administered 2012-11-16 (×2): 0.5 mg via INTRAVENOUS
  Administered 2012-11-16: 0.25 mg via INTRAVENOUS

## 2012-11-16 MED ORDER — BUPIVACAINE-EPINEPHRINE PF 0.25-1:200000 % IJ SOLN
INTRAMUSCULAR | Status: AC
  Filled 2012-11-16: qty 30

## 2012-11-16 MED ORDER — OXYCODONE HCL 5 MG PO TABS
ORAL_TABLET | ORAL | Status: AC
  Administered 2012-11-16: 5 mg via ORAL
  Filled 2012-11-16: qty 2

## 2012-11-16 MED ORDER — LABETALOL HCL 5 MG/ML IV SOLN
INTRAVENOUS | Status: DC | PRN
  Administered 2012-11-16 (×2): 10 mg via INTRAVENOUS

## 2012-11-16 MED ORDER — ACETAMINOPHEN 325 MG PO TABS: 650.0000 mg | ORAL_TABLET | ORAL | Status: DC | PRN

## 2012-11-16 MED ORDER — FENTANYL CITRATE 0.05 MG/ML IJ SOLN
INTRAMUSCULAR | Status: DC | PRN
  Administered 2012-11-16 (×5): 50 ug via INTRAVENOUS

## 2012-11-16 MED ORDER — SODIUM CHLORIDE 0.9 % IJ SOLN: 3.0000 mL | Freq: Two times a day (BID) | INTRAMUSCULAR | Status: DC

## 2012-11-16 MED ORDER — OXYCODONE HCL 5 MG/5ML PO SOLN
5.0000 mg | Freq: Once | ORAL | Status: DC | PRN
Start: 2012-11-16 — End: 2012-11-16

## 2012-11-16 MED ORDER — HYDROMORPHONE HCL PF 1 MG/ML IJ SOLN
INTRAMUSCULAR | Status: AC
  Administered 2012-11-16: 0.5 mg via INTRAVENOUS
  Filled 2012-11-16: qty 1

## 2012-11-16 MED ORDER — LACTATED RINGERS IV SOLN
INTRAVENOUS | Status: DC | PRN
  Administered 2012-11-16 (×2): via INTRAVENOUS

## 2012-11-16 MED ORDER — 0.9 % SODIUM CHLORIDE (POUR BTL) OPTIME
TOPICAL | Status: DC | PRN
  Administered 2012-11-16: 1000 mL

## 2012-11-16 SURGICAL SUPPLY — 41 items
APPLIER CLIP ROT 10 11.4 M/L (STAPLE) ×2
BLADE SURG ROTATE 9660 (MISCELLANEOUS) ×2 IMPLANT
CANISTER SUCTION 2500CC (MISCELLANEOUS) ×2 IMPLANT
CHLORAPREP W/TINT 26ML (MISCELLANEOUS) ×2 IMPLANT
CLIP APPLIE ROT 10 11.4 M/L (STAPLE) ×1 IMPLANT
CLOTH BEACON ORANGE TIMEOUT ST (SAFETY) ×2 IMPLANT
COVER MAYO STAND STRL (DRAPES) ×2 IMPLANT
COVER SURGICAL LIGHT HANDLE (MISCELLANEOUS) ×2 IMPLANT
DECANTER SPIKE VIAL GLASS SM (MISCELLANEOUS) ×2 IMPLANT
DERMABOND ADVANCED (GAUZE/BANDAGES/DRESSINGS) ×1
DERMABOND ADVANCED .7 DNX12 (GAUZE/BANDAGES/DRESSINGS) ×1 IMPLANT
DRAPE C-ARM 42X72 X-RAY (DRAPES) ×2 IMPLANT
DRAPE UTILITY 15X26 W/TAPE STR (DRAPE) ×4 IMPLANT
DRAPE WARM FLUID 44X44 (DRAPE) ×2 IMPLANT
ELECT REM PT RETURN 9FT ADLT (ELECTROSURGICAL) ×2
ELECTRODE REM PT RTRN 9FT ADLT (ELECTROSURGICAL) ×1 IMPLANT
GLOVE BIO SURGEON STRL SZ8 (GLOVE) ×2 IMPLANT
GLOVE BIOGEL PI IND STRL 7.0 (GLOVE) ×1 IMPLANT
GLOVE BIOGEL PI IND STRL 8 (GLOVE) ×1 IMPLANT
GLOVE BIOGEL PI INDICATOR 7.0 (GLOVE) ×1
GLOVE BIOGEL PI INDICATOR 8 (GLOVE) ×1
GOWN STRL NON-REIN LRG LVL3 (GOWN DISPOSABLE) ×6 IMPLANT
HEMOSTAT SNOW SURGICEL 2X4 (HEMOSTASIS) ×4 IMPLANT
KIT BASIN OR (CUSTOM PROCEDURE TRAY) ×2 IMPLANT
KIT ROOM TURNOVER OR (KITS) ×2 IMPLANT
NS IRRIG 1000ML POUR BTL (IV SOLUTION) ×4 IMPLANT
PAD ARMBOARD 7.5X6 YLW CONV (MISCELLANEOUS) ×2 IMPLANT
POUCH SPECIMEN RETRIEVAL 10MM (ENDOMECHANICALS) ×2 IMPLANT
SCISSORS LAP 5X35 DISP (ENDOMECHANICALS) IMPLANT
SET CHOLANGIOGRAPH 5 50 .035 (SET/KITS/TRAYS/PACK) ×4 IMPLANT
SET IRRIG TUBING LAPAROSCOPIC (IRRIGATION / IRRIGATOR) ×2 IMPLANT
SLEEVE ENDOPATH XCEL 5M (ENDOMECHANICALS) ×2 IMPLANT
SPECIMEN JAR SMALL (MISCELLANEOUS) ×2 IMPLANT
SUT MNCRL AB 4-0 PS2 18 (SUTURE) ×2 IMPLANT
SUT VICRYL 0 UR6 27IN ABS (SUTURE) ×6 IMPLANT
TOWEL OR 17X24 6PK STRL BLUE (TOWEL DISPOSABLE) ×2 IMPLANT
TOWEL OR 17X26 10 PK STRL BLUE (TOWEL DISPOSABLE) ×2 IMPLANT
TRAY LAPAROSCOPIC (CUSTOM PROCEDURE TRAY) ×2 IMPLANT
TROCAR XCEL BLUNT TIP 100MML (ENDOMECHANICALS) ×2 IMPLANT
TROCAR XCEL NON-BLD 11X100MML (ENDOMECHANICALS) ×2 IMPLANT
TROCAR XCEL NON-BLD 5MMX100MML (ENDOMECHANICALS) ×2 IMPLANT

## 2012-11-16 NOTE — OR Nursing (Signed)
(+)   void in bathroom

## 2012-11-16 NOTE — H&P (View-Only) (Signed)
Patient ID: Dennis Husak., male   DOB: November 12, 1945, 67 y.o.   MRN: [id number]  Chief Complaint  Patient presents with  . New Evaluation    eval GB    HPI Dennis Goffe. is a 67 y.o. male.  Patient sent today at the request of Dr. Ricki Miller for right upper quadrant abdominal pain. He has had episodes of severe sharp right upper quadrant abdominal pain which radiates to his back since October of last year. He has had at least 3-4 severe attacks of pain. The pain lasts minutes to hours and has been to the emergency room one time for the pain. Ultrasound reveals a large gallstone and sludge in the gallbladder with a borderline thickened gallbladder wall. He cannot relate his symptoms to any food he has eaten recently. Nausea and vomiting noted as well. HPI  Past Medical History  Diagnosis Date  . Depression   . Colitis   . Fatty liver   . History of colon polyps   . Alcohol abuse   . Anxiety   . Skin cancer, basal cell     Past Surgical History  Procedure Date  . Wisdom tooth extraction     Family History  Problem Relation Age of Onset  . Colon cancer Neg Hx     Social History History  Substance Use Topics  . Smoking status: Former Smoker    Types: Cigarettes    Quit date: 10/12/1970  . Smokeless tobacco: Never Used  . Alcohol Use: No    No Known Allergies  Current Outpatient Prescriptions  Medication Sig Dispense Refill  . buPROPion (WELLBUTRIN XL) 150 MG 24 hr tablet Take 150 mg by mouth daily.       . clonazePAM (KLONOPIN) 2 MG tablet Take 2 mg by mouth 3 (three) times daily. Facial twitch Scheduled doses      . escitalopram (LEXAPRO) 10 MG tablet Take 10 mg by mouth daily.      Marland Kitchen HYDROcodone-acetaminophen (VICODIN) 5-500 MG per tablet Take 1-2 tablets by mouth every 6 (six) hours as needed for pain.  50 tablet  0  . traZODone (DESYREL) 50 MG tablet Take 50-200 mg by mouth at bedtime as needed. For sleep Usually takes 2 tabs, can take up to 4 tabs if needed      .  Naproxen Sodium (ALEVE PO) Take 1 tablet by mouth every 12 (twelve) hours as needed. Chest wall/ rib pain        Review of Systems Review of Systems  Constitutional: Negative for fever, chills and unexpected weight change.  HENT: Negative for hearing loss, congestion, sore throat, trouble swallowing and voice change.   Eyes: Negative for visual disturbance.  Respiratory: Negative for cough and wheezing.   Cardiovascular: Negative for chest pain, palpitations and leg swelling.  Gastrointestinal: Negative for nausea, vomiting, abdominal pain, diarrhea, constipation, blood in stool, abdominal distention, anal bleeding and rectal pain.  Genitourinary: Negative for hematuria and difficulty urinating.  Musculoskeletal: Negative for arthralgias.  Skin: Negative for rash and wound.  Neurological: Negative for seizures, syncope, weakness and headaches.  Hematological: Negative for adenopathy. Does not bruise/bleed easily.  Psychiatric/Behavioral: Negative for confusion.    Blood pressure 122/72, pulse 72, temperature 98 F (36.7 C), temperature source Temporal, resp. rate 16, height 5\' 8"  (1.727 m), weight 202 lb 3.2 oz (91.717 kg).  Physical Exam Physical Exam  Constitutional: He is oriented to person, place, and time. He appears well-developed and well-nourished.  HENT:  Head: Normocephalic and  atraumatic.  Eyes: EOM are normal. Pupils are equal, round, and reactive to light.  Neck: Normal range of motion. Neck supple.  Cardiovascular: Normal rate and regular rhythm.   Pulmonary/Chest: Effort normal and breath sounds normal.  Abdominal: Soft. Bowel sounds are normal. He exhibits no distension. There is no tenderness. There is no rebound and no guarding.  Musculoskeletal: Normal range of motion.  Neurological: He is alert and oriented to person, place, and time.  Skin: Skin is warm.  Psychiatric: He has a normal mood and affect. His behavior is normal. Judgment and thought content normal.     Data Reviewed Clinical Data: abdominal pain, evaluate for gallstones  COMPLETE ABDOMINAL ULTRASOUND  Comparison: CT scan performed 08/04/2012  Findings:  Gallbladder: The gallbladder lumen is filled with sludge. There  is a 2.0 cm gallstone. Gallbladder wall is of borderline thickness  at about 3.5 mm. There is no sonographic [employer].  Common bile duct: Normal at under 5 mm  Liver: No focal lesion identified. Within normal limits in  parenchymal echogenicity.  IVC: Appears normal.  Pancreas: Only evaluated in limited detail but appears grossly  normal  Spleen: Measures  Right Kidney: Measures  Left Kidney: Measures  Abdominal aorta: No aneurysm identified.  IMPRESSION:  Abundant sludge within the gallbladder with a 2.0 cm gallstone and  borderline wall thickness. The findings can be seen with  cholecystitis, although there is no sonographic Murphy's sign.    Assessment    Gallstones / biliary colic/  Chronic inflammation of gallbladder    Plan    Laparoscopic cholecystectomy and cholangiogram.  The procedure has been discussed with the patient. Operative and non operative treatments have been discussed. Risks of surgery include bleeding, infection,  Common bile duct injury,  Injury to the stomach,liver, colon,small intestine, abdominal wall,  Diaphragm,  Major blood vessels,  And the need for an open procedure.  Other risks include worsening of medical problems, death,  DVT and pulmonary embolism, and cardiovascular events.   Medical options have also been discussed. The patient has been informed of long term expectations of surgery and non surgical options,  The patient agrees to proceed.         Karolina Zamor A. 10/21/2012, 11:47 AM

## 2012-11-16 NOTE — Anesthesia Postprocedure Evaluation (Signed)
Anesthesia Post Note  Patient: Dennis Woodard.  Procedure(s) Performed: Procedure(s) (LRB): LAPAROSCOPIC CHOLECYSTECTOMY WITH INTRAOPERATIVE CHOLANGIOGRAM (N/A)  Anesthesia type: general  Patient location: PACU  Post pain: Pain level controlled  Post assessment: Patient's Cardiovascular Status Stable  Last Vitals:  Filed Vitals:   11/16/12 1430  BP: 174/77  Pulse: 61  Temp:   Resp: 17    Post vital signs: Reviewed and stable  Level of consciousness: sedated  Complications: No apparent anesthesia complications

## 2012-11-16 NOTE — Interval H&P Note (Signed)
History and Physical Interval Note:  11/16/2012 11:40 AM  Dennis Woodard.  has presented today for surgery, with the diagnosis of gallstones  The various methods of treatment have been discussed with the patient and family. After consideration of risks, benefits and other options for treatment, the patient has consented to  Procedure(s) (LRB) with comments: LAPAROSCOPIC CHOLECYSTECTOMY WITH INTRAOPERATIVE CHOLANGIOGRAM (N/A) as a surgical intervention .  The patient's history has been reviewed, patient examined, no change in status, stable for surgery.  I have reviewed the patient's chart and labs.  Questions were answered to the patient's satisfaction.     Sharonna Vinje A.

## 2012-11-16 NOTE — Op Note (Signed)
Laparoscopic Cholecystectomy with IOC Procedure Note  Indications: This patient presents with symptomatic gallbladder disease and will undergo laparoscopic cholecystectomy.The procedure has been discussed with the patient. Operative and non operative treatments have been discussed. Risks of surgery include bleeding, infection,  Common bile duct injury,  Injury to the stomach,liver, colon,small intestine, abdominal wall,  Diaphragm,  Major blood vessels,  And the need for an open procedure.  Other risks include worsening of medical problems, death,  DVT and pulmonary embolism, and cardiovascular events.   Medical options have also been discussed. The patient has been informed of long term expectations of surgery and non surgical options,  The patient agrees to proceed.    Pre-operative Diagnosis: Chronic cholecystitis  Post-operative Diagnosis: Same  Surgeon: Katelyn Broadnax A.   Assistants: OR staff  Anesthesia: General endotracheal anesthesia and Local anesthesia 0.25.% bupivacaine, with epinephrine  ASA Class: 3  Procedure Details  The patient was seen again in the Holding Room. The risks, benefits, complications, treatment options, and expected outcomes were discussed with the patient. The possibilities of reaction to medication, pulmonary aspiration, perforation of viscus, bleeding, recurrent infection, finding a normal gallbladder, the need for additional procedures, failure to diagnose a condition, the possible need to convert to an open procedure, and creating a complication requiring transfusion or operation were discussed with the patient. The patient and/or family concurred with the proposed plan, giving informed consent. The site of surgery properly noted/marked. The patient was taken to Operating Room, identified as Dennis Woodard. and the procedure verified as Laparoscopic Cholecystectomy with Intraoperative Cholangiograms. A Time Out was held and the above information  confirmed.  Prior to the induction of general anesthesia, antibiotic prophylaxis was administered. General endotracheal anesthesia was then administered and tolerated well. After the induction, the abdomen was prepped in the usual sterile fashion. The patient was positioned in the supine position with the left arm comfortably tucked, along with some reverse Trendelenburg.  Local anesthetic agent was injected into the skin near the umbilicus and an incision made. The midline fascia was incised and the Hasson technique was used to introduce a 12 mm port under direct vision. It was secured with a figure of eight Vicryl suture placed in the usual fashion. Pneumoperitoneum was then created with CO2 and tolerated well without any adverse changes in the patient's vital signs. Additional trocars were introduced under direct vision with an 11 mm trocar in the epigastrium and 2 5 mm trocars in the right upper quadrant. All skin incisions were infiltrated with a local anesthetic agent before making the incision and placing the trocars.   The gallbladder was identified, the fundus grasped and retracted cephalad. Adhesions were lysed bluntly and with the electrocautery where indicated, taking care not to injure any adjacent organs or viscus. The patient has significant chronic inflammation.  The infundibulum was grasped and retracted laterally, exposing the peritoneum overlying the triangle of Calot. This was then divided and exposed in a blunt fashion. The cystic duct was clearly identified and bluntly dissected circumferentially. The junctions of the gallbladder, cystic duct and common bile duct were clearly identified prior to the division of any linear structure.   An incision was made in the cystic duct and the cholangiogram catheter introduced. The catheter was secured using an endoclip. The study showed no stones and good visualization of the distal and proximal biliary tree. The catheter was then removed.   The  cystic duct was then  ligated with surgical clips  on the patient side  and  clipped on the gallbladder side and divided. The cystic artery was identified, dissected free, ligated with clips and divided as well. Posterior cystic artery clipped and divided. The gallbladder bed  was very vascular and inflamed.   The gallbladder was dissected from the liver bed in retrograde fashion with the electrocautery. The gallbladder was removed. The liver bed was irrigated and inspected. Hemostasis was achieved with the electrocautery and SURGICEL. . Copious irrigation was utilized and was repeatedly aspirated until clear all particulate matter. Hemostasis was achieved with no signs  Of bleeding or bile leakage. Gallbladder removed with endocatch bag. No signs of injury to viscera.  Pneumoperitoneum was completely reduced after viewing removal of the trocars under direct vision. The wound was thoroughly irrigated and the fascia was then closed with a figure of eight suture; the skin was then closed with 4 0 moncryl and a sterile Dermabond  was applied.  Instrument, sponge, and needle counts were correct at closure and at the conclusion of the case.   Findings: Cholecystitis with Cholelithiasis  Estimated Blood Loss: less than 100 mL         Drains: none         Total IV Fluids: 1500 mL         Specimens: Gallbladder           Complications: None; patient tolerated the procedure well.         Disposition: PACU - hemodynamically stable.         Condition: stable

## 2012-11-16 NOTE — Anesthesia Procedure Notes (Signed)
Procedure Name: Intubation Date/Time: 11/16/2012 12:20 PM Performed by: Margaree Mackintosh Pre-anesthesia Checklist: Patient identified, Timeout performed, Emergency Drugs available, Suction available and Patient being monitored Patient Re-evaluated:Patient Re-evaluated prior to inductionOxygen Delivery Method: Circle system utilized Preoxygenation: Pre-oxygenation with 100% oxygen Intubation Type: IV induction Ventilation: Mask ventilation without difficulty and Oral airway inserted - appropriate to patient size Laryngoscope Size: Mac and 3 Grade View: Grade II Tube type: Oral Tube size: 7.5 mm Number of attempts: 2 Airway Equipment and Method: Stylet and Video-laryngoscopy Placement Confirmation: ETT inserted through vocal cords under direct vision,  positive ETCO2 and breath sounds checked- equal and bilateral Secured at: 22 cm Tube secured with: Tape Dental Injury: Teeth and Oropharynx as per pre-operative assessment  Difficulty Due To: Difficulty was anticipated, Difficult Airway- due to anterior larynx and Difficult Airway- due to limited oral opening Comments: DLx 1 by A.Molli Knock, CRNA no view, mask ventilation, DL by Dr.Massagee unsuccessful attempt at ETT placement. Glidescope utilized, successful with first attempt.

## 2012-11-16 NOTE — Transfer of Care (Signed)
Immediate Anesthesia Transfer of Care Note  Patient: Dennis Woodard.  Procedure(s) Performed: Procedure(s) (LRB) with comments: LAPAROSCOPIC CHOLECYSTECTOMY WITH INTRAOPERATIVE CHOLANGIOGRAM (N/A)  Patient Location: PACU  Anesthesia Type:General  Level of Consciousness: awake, alert  and oriented  Airway & Oxygen Therapy: Patient Spontanous Breathing and Patient connected to nasal cannula oxygen  Post-op Assessment: Report given to PACU RN and Post -op Vital signs reviewed and stable  Post vital signs: Reviewed and stable  Complications: No apparent anesthesia complications

## 2012-11-16 NOTE — Preoperative (Signed)
Beta Blockers   Reason not to administer Beta Blockers:Not Applicable 

## 2012-11-16 NOTE — Anesthesia Preprocedure Evaluation (Addendum)
Anesthesia Evaluation  Patient identified by MRN, date of birth, ID band Patient awake    Reviewed: Allergy & Precautions, H&P , NPO status , Patient's Chart, lab work & pertinent test results  History of Anesthesia Complications Negative for: history of anesthetic complications  Airway Mallampati: III TM Distance: <3 FB   Mouth opening: Limited Mouth Opening  Dental  (+) Teeth Intact and Dental Advisory Given   Pulmonary sleep apnea ,  breath sounds clear to auscultation        Cardiovascular negative cardio ROS  Rhythm:Regular Rate:Normal     Neuro/Psych Anxiety Depression negative neurological ROS     GI/Hepatic negative GI ROS, Hx of Fatty liver   Endo/Other  negative endocrine ROS  Renal/GU negative Renal ROS     Musculoskeletal negative musculoskeletal ROS (+)   Abdominal   Peds  Hematology negative hematology ROS (+)   Anesthesia Other Findings   Reproductive/Obstetrics                          Anesthesia Physical Anesthesia Plan  ASA: II  Anesthesia Plan: General   Post-op Pain Management:    Induction: Intravenous  Airway Management Planned: Oral ETT  Additional Equipment:   Intra-op Plan:   Post-operative Plan: Extubation in OR  Informed Consent:   Dental advisory given  Plan Discussed with: CRNA and Surgeon  Anesthesia Plan Comments:         Anesthesia Quick Evaluation

## 2012-11-18 ENCOUNTER — Encounter (HOSPITAL_COMMUNITY): Payer: Self-pay | Admitting: Surgery

## 2012-11-21 ENCOUNTER — Telehealth (INDEPENDENT_AMBULATORY_CARE_PROVIDER_SITE_OTHER): Payer: Self-pay | Admitting: General Surgery

## 2012-11-21 NOTE — Telephone Encounter (Signed)
Pt called to report he is still having Rt sided pain; is has recent lap chole and has resumed normal activity.  Advised pt to limit his activities until he is better able to tolerate them, especially limit lifting, carrying, pushing or pulling anything over 20 pounds, limit work-outs to walking only for now.  Use NSAIDS for the anti-inflammatory effects.  He understands and will comply.

## 2012-11-22 ENCOUNTER — Telehealth (INDEPENDENT_AMBULATORY_CARE_PROVIDER_SITE_OTHER): Payer: Self-pay

## 2012-11-22 NOTE — Telephone Encounter (Signed)
Patient comes into the office today without appointment.  Patient states he tried calling our office numerous times over the weekend but was unable to get though to anyone.  Patient had several questions regarding surgical glue placed on his incisions.  Patient reports having mild abdominal discomfort.  Patient denies, nausea/vomitng or having any fever.  Patient reports having bowel movements and denies any difficulty with voiding.  Patient advised he can take his pain medication (Oxycodone) as needed for pain and he can alternate with aleve or ibuprofen for mild pain.  All questions and concerns were addressed.  Baxter Hire, RN was present.

## 2012-11-22 NOTE — Telephone Encounter (Signed)
Pt aware prescription up at front desk.

## 2012-11-22 NOTE — Telephone Encounter (Signed)
Sure

## 2012-11-22 NOTE — Telephone Encounter (Signed)
Patient calling office today requesting a refill on his Oxycodone 10/325mg .  Patient concerned about the winter storm warnings and did not want to be without pain medication.  I offered to call in our refill protocol but, patient prefers Oxycodone 10/325mg  instead of Hydrocodone 5/325mg .  Please advise if okay to refill.  Patient s/p Lap Cholecystectomy 11/16/2012.

## 2012-12-05 ENCOUNTER — Encounter (INDEPENDENT_AMBULATORY_CARE_PROVIDER_SITE_OTHER): Payer: Self-pay | Admitting: Surgery

## 2012-12-05 ENCOUNTER — Ambulatory Visit (INDEPENDENT_AMBULATORY_CARE_PROVIDER_SITE_OTHER): Payer: BC Managed Care – PPO | Admitting: Surgery

## 2012-12-05 ENCOUNTER — Encounter (INDEPENDENT_AMBULATORY_CARE_PROVIDER_SITE_OTHER): Payer: Self-pay

## 2012-12-05 VITALS — BP 140/62 | HR 71 | Temp 98.5°F | Resp 18 | Ht 68.0 in | Wt 194.0 lb

## 2012-12-05 DIAGNOSIS — Z9889 Other specified postprocedural states: Secondary | ICD-10-CM

## 2012-12-05 NOTE — Progress Notes (Signed)
NAME: Dennis HICKOX JR.       DOB: Jan 28, 1946           DATE: 12/05/2012       ZOX:[id number]   CC: Postop laparoscopic cholecystectomy  Impression:  The patient appears to be doing well, with improvement in his symptoms.  Plan:  He may resume full activity and regular diet. He  will followup with [place] on a p.r.n. basis. I did tell him that he may still have some foods that cause indigestion and ask him to call us if there are any questions, problems or concerns.  HPI:  This patient underwent a laparoscopic cholecystectomy and  operative cholangiogram on 11/16/2012 . He is in for his first postoperative visit. He notes that his incisional pain has resolved. His preoperative symptoms have improved. He is not having problems with nausea, vomiting, diarrhea, fevers, chills, or urinary symptoms. He is tolerating diet. He feels that he is progressing well and nearly back to normal. PE:  VS: BP 140/62  Pulse 71  Temp(Src) 98.5 F (36.9 C) (Temporal)  Resp 18  Ht 5\' 8"  (1.727 m)  Wt 194 lb (87.998 kg)  BMI 29.5 kg/m2  General: The patient is alert and appears comfortable, NAD.  Abdomen: Soft and benign. The incisions are healing nicely. There are no apparent problems.  Data reviewed: IOC:  normal Pathology:  gangrenous cholecystitis

## 2012-12-05 NOTE — Patient Instructions (Signed)
Return to work 12/12/2012

## 2013-02-10 ENCOUNTER — Telehealth: Payer: Self-pay

## 2013-02-10 NOTE — Telephone Encounter (Signed)
Called pt, advised would forward to Carolyn's asst to schedule. Pt agreed.

## 2013-02-10 NOTE — Telephone Encounter (Signed)
Patient called stating he wants to schedule an appt with Eber Jones.  Says he would like the earliest available in the 10:30-12:00 time frame.  Please call to schedule.  Thank you.

## 2013-02-14 ENCOUNTER — Other Ambulatory Visit: Payer: Self-pay

## 2013-02-14 NOTE — Telephone Encounter (Signed)
Patient is trying to get an appt, but needs refill on med.

## 2013-02-15 ENCOUNTER — Other Ambulatory Visit: Payer: Self-pay | Admitting: Nurse Practitioner

## 2013-02-15 MED ORDER — CLONAZEPAM 2 MG PO TABS: 2.0000 mg | ORAL_TABLET | Freq: Three times a day (TID) | ORAL | Status: DC

## 2013-02-15 NOTE — Telephone Encounter (Signed)
Former [facility] Patient requesting refill.  Has not been assigned new MD yet.

## 2013-02-16 NOTE — Telephone Encounter (Signed)
Centricity notes have been printed and given to Dr Dohmeier's medical assistant.

## 2013-02-16 NOTE — Telephone Encounter (Signed)
Patient will need Rv with Dennis Woodard - was Dr. Terrace Arabia the [facility] ?   Please obtain the centricity notes on this patient for me to review. CD Send to  p [facility]>

## 2013-04-07 ENCOUNTER — Encounter: Payer: Self-pay | Admitting: Nurse Practitioner

## 2013-04-07 ENCOUNTER — Ambulatory Visit (INDEPENDENT_AMBULATORY_CARE_PROVIDER_SITE_OTHER): Payer: BC Managed Care – PPO | Admitting: Nurse Practitioner

## 2013-04-07 ENCOUNTER — Telehealth (INDEPENDENT_AMBULATORY_CARE_PROVIDER_SITE_OTHER): Payer: Self-pay

## 2013-04-07 VITALS — BP 112/61 | HR 77 | Ht 69.0 in | Wt 213.0 lb

## 2013-04-07 DIAGNOSIS — R259 Unspecified abnormal involuntary movements: Secondary | ICD-10-CM

## 2013-04-07 NOTE — Patient Instructions (Addendum)
Hemifacial spasm in good control Continue clonazepam as ordered does not need refills Follow up yearly and when necessary

## 2013-04-07 NOTE — Telephone Encounter (Signed)
Patient states he picked up a suit case last week and he has been having Lower right abdomen pain /?hernia appt with DR .Cornett 04/21/13 Advised to go to the ER. If pain got worse

## 2013-04-07 NOTE — Progress Notes (Signed)
HPI: Patient returns for his followup at her last visit 10/28/2011. 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 He has tried Botox in the past with little benefit. He did well on a combination of Depakote and Klonopin however he lost his insurance benefits and can no longer afford the Depakote. He has continued on the Klonopin. His facial spasms are worse with stressful situations. He also has a history of depression and anxiety treated by Dr. Jennelle Human. He has continued to gain weight and gets a little exercise  He also complains of intermittent joint pain and low back pain.   ROS:  Depression followed by Dr. Jennelle Human  Physical Exam General: well developed, well nourished, seated, in no evident distress Head: head normocephalic and atraumatic. Oropharynx benign Neck: supple with no carotid  bruits Cardiovascular: regular rate and rhythm, no murmurs  Neurologic Exam Mental Status: Awake and fully alert. Oriented to place and time. Follows all commands. Speech and language normal.   Cranial Nerves: Fundoscopic exam reveals sharp disc margins. Pupils equal, briskly reactive to light. Extraocular movements full without nystagmus. Visual fields full to confrontation. Hearing intact and symmetric to finger snap. Facial sensation intact. Face, tongue, palate move normally and symmetrically. Neck flexion and extension normal. Very intermittent left hemi- facial spasm Motor: Normal bulk and tone. Normal strength in all tested extremity muscles.No focal weakness. No outstretched tremor Sensory.: intact to touch and pinprick and vibratory.  Coordination: Rapid alternating movements normal in all extremities. Finger-to-nose and heel-to-shin performed accurately bilaterally. Gait and Station: Arises from chair without difficulty. Stance is normal. Gait demonstrates normal stride length and balance . Able to heel, toe and tandem walk without difficulty.   Reflexes: 2+ and symmetric. Toes downgoing.     ASSESSMENT: Abnormal movements, left knee facial spasm in good control on clonazepam     PLAN: Continue clonazepam as ordered does not need refills Follow up yearly and when necessary Nilda Riggs, GNP-BC APRN

## 2013-04-10 ENCOUNTER — Telehealth (INDEPENDENT_AMBULATORY_CARE_PROVIDER_SITE_OTHER): Payer: Self-pay | Admitting: General Surgery

## 2013-04-10 NOTE — Telephone Encounter (Signed)
Patient calling with ongoing RUQ pain that comes and goes. Patient has lap chole at the beginning of February. He states it is not really related to activities. He was lifting luggage a few weeks ago and had the problem, but states he was laying on the couch watching TV yesterday and had the pain. He states it resembles the feeling he had with his gallbladder pain but not as painful. I advised patient to call his PCP or GI MD to evaluate this ongoing pain and we would see him if something surgical was found. He agrees with this plan.

## 2013-04-21 ENCOUNTER — Encounter (INDEPENDENT_AMBULATORY_CARE_PROVIDER_SITE_OTHER): Payer: BC Managed Care – PPO | Admitting: Surgery

## 2013-05-03 ENCOUNTER — Other Ambulatory Visit: Payer: Self-pay | Admitting: Neurology

## 2013-05-04 ENCOUNTER — Other Ambulatory Visit: Payer: Self-pay | Admitting: Neurology

## 2013-05-04 ENCOUNTER — Other Ambulatory Visit: Payer: Self-pay

## 2013-05-04 MED ORDER — CLONAZEPAM 2 MG PO TABS: 2.0000 mg | ORAL_TABLET | Freq: Three times a day (TID) | ORAL | Status: DC

## 2013-07-06 ENCOUNTER — Encounter: Payer: Self-pay | Admitting: Gastroenterology

## 2013-07-13 ENCOUNTER — Telehealth: Payer: Self-pay | Admitting: Gastroenterology

## 2013-07-13 NOTE — Telephone Encounter (Signed)
Pt has been notified and will call back to schedule after he reviews his calendar at work

## 2013-09-21 ENCOUNTER — Encounter: Payer: Self-pay | Admitting: Gastroenterology

## 2013-09-25 ENCOUNTER — Telehealth (INDEPENDENT_AMBULATORY_CARE_PROVIDER_SITE_OTHER): Payer: Self-pay

## 2013-09-25 DIAGNOSIS — G8918 Other acute postprocedural pain: Secondary | ICD-10-CM

## 2013-09-25 NOTE — Telephone Encounter (Signed)
Not sure.  Will need to check a CT scan to see whats going on.  Unlikely related to surgery but will check

## 2013-09-25 NOTE — Addendum Note (Signed)
Addended by: Brennan Bailey on: 09/25/2013 04:02 PM   Modules accepted: Orders

## 2013-09-25 NOTE — Telephone Encounter (Signed)
Spoke to pt and relayed below message. He would like CT. Order put in.

## 2013-09-25 NOTE — Telephone Encounter (Signed)
Pt called s/p lap chole in February 2014.  Starting one month after surgery he was having sharp pain on the right side where the gall bladder was.  It hurts when he coughs, sneezes, takes a deep breath or moves around.  It does not hurt to touch.  He has put on a lot of weight since surgery.  When he eats his abdomen becomes bloated.  The pain started a month po and stopped for a month and a half.  It has started back up.  He wants to know if this could be related to surgery or what he should do about it.

## 2013-09-26 ENCOUNTER — Other Ambulatory Visit: Payer: Self-pay | Admitting: Dermatology

## 2013-09-26 NOTE — Telephone Encounter (Signed)
I spoke with pt and informed him of the appt for his CT scan at [facility] on 12/22 with an arrival time of 10:00am.  Instructed him to go sometime this week and pick the contrast up.

## 2013-10-02 ENCOUNTER — Ambulatory Visit
Admission: RE | Admit: 2013-10-02 | Discharge: 2013-10-02 | Disposition: A | Discharge status: Home / Self Care | Payer: BC Managed Care – PPO | Source: Ambulatory Visit | Attending: Surgery | Admitting: Surgery

## 2013-10-02 DIAGNOSIS — G8918 Other acute postprocedural pain: Secondary | ICD-10-CM

## 2013-10-02 MED ORDER — IOHEXOL 300 MG/ML  SOLN
125.0000 mL | Freq: Once | INTRAMUSCULAR | Status: AC | PRN
  Administered 2013-10-02: 125 mL via INTRAVENOUS

## 2013-10-03 ENCOUNTER — Encounter: Payer: Self-pay | Admitting: Gastroenterology

## 2013-10-03 ENCOUNTER — Ambulatory Visit (AMBULATORY_SURGERY_CENTER): Payer: BC Managed Care – PPO

## 2013-10-03 VITALS — Ht 68.0 in | Wt 190.0 lb

## 2013-10-03 DIAGNOSIS — Z860100 Personal history of colon polyps, unspecified: Secondary | ICD-10-CM

## 2013-10-03 MED ORDER — MOVIPREP 100 G PO SOLR: 1.0000 | Freq: Once | ORAL | Status: DC

## 2013-10-09 ENCOUNTER — Telehealth (INDEPENDENT_AMBULATORY_CARE_PROVIDER_SITE_OTHER): Payer: Self-pay

## 2013-10-09 NOTE — Telephone Encounter (Signed)
Called pt with negative CT results

## 2013-10-10 ENCOUNTER — Other Ambulatory Visit: Payer: BC Managed Care – PPO | Admitting: Gastroenterology

## 2013-10-10 ENCOUNTER — Ambulatory Visit (AMBULATORY_SURGERY_CENTER): Payer: BC Managed Care – PPO | Admitting: Gastroenterology

## 2013-10-10 ENCOUNTER — Encounter: Payer: Self-pay | Admitting: Gastroenterology

## 2013-10-10 VITALS — BP 118/66 | HR 57 | Temp 97.0°F | Resp 15 | Ht 68.0 in | Wt 190.0 lb

## 2013-10-10 DIAGNOSIS — Z86010 Personal history of colonic polyps: Secondary | ICD-10-CM

## 2013-10-10 MED ORDER — SODIUM CHLORIDE 0.9 % IV SOLN: 500.0000 mL | INTRAVENOUS | Status: DC

## 2013-10-10 NOTE — Op Note (Signed)
[facility] [address] [place], [address]   COLONOSCOPY PROCEDURE REPORT  PATIENT: Dennis Woodard, Dennis Woodard  MR#: [id number] BIRTHDATE: 09/24/1946 , 66  yrs. old GENDER: Male ENDOSCOPIST: Rachael Fee, MD PROCEDURE DATE:  10/10/2013 PROCEDURE:   Colonoscopy, surveillance First Screening Colonoscopy - Avg.  risk and is 50 yrs.  old or older - No.  Prior Negative Screening - Now for repeat screening. N/A  History of Adenoma - Now for follow-up colonoscopy & has been > or = to 3 yrs.  No.  It has been less than 3 yrs since last colonoscopy.  Medical reason.  Polyps Removed Today? No.  Recommend repeat exam, <10 yrs? Yes.  High risk (family or personal hx). ASA CLASS:   Class II INDICATIONS:nine polyps removed 2013. MEDICATIONS: Fentanyl 75 mcg IV, Versed 8 mg IV, and These medications were titrated to patient response per physician's verbal order  DESCRIPTION OF PROCEDURE:   After the risks benefits and alternatives of the procedure were thoroughly explained, informed consent was obtained.  A digital rectal exam revealed no abnormalities of the rectum.   The LB [id number]  endoscope was introduced through the anus and advanced to the cecum, which was identified by both the appendix and ileocecal valve. No adverse events experienced.   The quality of the prep was excellent.  The instrument was then slowly withdrawn as the colon was fully examined.   COLON FINDINGS: A normal appearing cecum, ileocecal valve, and appendiceal orifice were identified.  The ascending, hepatic flexure, transverse, splenic flexure, descending, sigmoid colon and rectum appeared unremarkable.  No polyps or cancers were seen. Retroflexed views revealed no abnormalities. The time to cecum=3 minutes 49 seconds.  Withdrawal time=16 minutes 52 seconds.  The scope was withdrawn and the procedure completed. COMPLICATIONS: There were no complications.  ENDOSCOPIC IMPRESSION: Normal  colon No polyps or cancers  RECOMMENDATIONS: Given the large number of polyps you had last year, I recommend repeat colonoscopy in 3 years from now.   eSigned:  Rachael Fee, MD 10/10/2013 2:39 PM

## 2013-10-10 NOTE — Patient Instructions (Signed)
Discharge instructions given with verbal understanding. Normal . Resume previous medications. YOU HAD AN ENDOSCOPIC PROCEDURE TODAY AT THE [facility]: Refer to the procedure report that was given to you for any specific questions about what was found during the examination.  If the procedure report does not answer your questions, please call your gastroenterologist to clarify.  If you requested that your care partner not be given the details of your procedure findings, then the procedure report has been included in a sealed envelope for you to review at your convenience later.  YOU SHOULD EXPECT: Some feelings of bloating in the abdomen. Passage of more gas than usual.  Walking can help get rid of the air that was put into your GI tract during the procedure and reduce the bloating. If you had a lower endoscopy (such as a colonoscopy or flexible sigmoidoscopy) you may notice spotting of blood in your stool or on the toilet paper. If you underwent a bowel prep for your procedure, then you may not have a normal bowel movement for a few days.  DIET: Your first meal following the procedure should be a light meal and then it is ok to progress to your normal diet.  A half-sandwich or bowl of soup is an example of a good first meal.  Heavy or fried foods are harder to digest and may make you feel nauseous or bloated.  Likewise meals heavy in dairy and vegetables can cause extra gas to form and this can also increase the bloating.  Drink plenty of fluids but you should avoid alcoholic beverages for 24 hours.  ACTIVITY: Your care partner should take you home directly after the procedure.  You should plan to take it easy, moving slowly for the rest of the day.  You can resume normal activity the day after the procedure however you should NOT DRIVE or use heavy machinery for 24 hours (because of the sedation medicines used during the test).    SYMPTOMS TO REPORT IMMEDIATELY: A gastroenterologist can be  reached at any hour.  During normal business hours, 8:30 AM to 5:00 PM Monday through Friday, call [phone].  After hours and on weekends, please call the GI answering service at [phone] who will take a message and have the physician on call contact you.   Following lower endoscopy (colonoscopy or flexible sigmoidoscopy):  Excessive amounts of blood in the stool  Significant tenderness or worsening of abdominal pains  Swelling of the abdomen that is new, acute  Fever of 100F or higher  FOLLOW UP: If any biopsies were taken you will be contacted by phone or by letter within the next 1-3 weeks.  Call your gastroenterologist if you have not heard about the biopsies in 3 weeks.  Our staff will call the home number listed on your records the next business day following your procedure to check on you and address any questions or concerns that you may have at that time regarding the information given to you following your procedure. This is a courtesy call and so if there is no answer at the home number and we have not heard from you through the emergency physician on call, we will assume that you have returned to your regular daily activities without incident.  SIGNATURES/CONFIDENTIALITY: You and/or your care partner have signed paperwork which will be entered into your electronic medical record.  These signatures attest to the fact that that the information above on your After Visit Summary has been reviewed  and is understood.  Full responsibility of the confidentiality of this discharge information lies with you and/or your care-partner.

## 2013-10-11 ENCOUNTER — Telehealth: Payer: Self-pay | Admitting: *Deleted

## 2013-10-11 NOTE — Telephone Encounter (Signed)
Left message that we called for f/u 

## 2013-10-17 ENCOUNTER — Other Ambulatory Visit: Payer: Self-pay | Admitting: Dermatology

## 2013-11-02 ENCOUNTER — Telehealth: Payer: Self-pay | Admitting: Nurse Practitioner

## 2013-11-02 ENCOUNTER — Other Ambulatory Visit: Payer: Self-pay

## 2013-11-02 MED ORDER — CLONAZEPAM 2 MG PO TABS: 2.0000 mg | ORAL_TABLET | Freq: Three times a day (TID) | ORAL | Status: DC

## 2013-11-02 NOTE — Telephone Encounter (Signed)
PT called and stated that [facility] faxed over an authorization for refill on his Cloneazepam 2mg  tab.  He wanted to make sure it has been received and when could he expect it to be filled as he is running low on his prescription.

## 2013-11-03 NOTE — Telephone Encounter (Signed)
Rx signed and faxed.

## 2014-02-07 IMAGING — CT CT ABD-PELV W/ CM
2 of 5 series · 17 of 46 positions shown, 19 images · IV contrast (READICAT/WATER & [ID] OMNI 300)
Comparison: CT scan of the abdomen and pelvis dated 08/04/2012

CLINICAL DATA: Abdominal pain at the site of cholecystectomy
performed on 11/16/2012.

BUN and creatinine were obtained on site at [HOSPITAL] at
[HOSPITAL].Results: BUN 16 mg/dL, Creatinine 1.0 mg/dL.
EXAM:
CT ABDOMEN AND PELVIS WITH CONTRAST
TECHNIQUE: Multidetector CT imaging of the abdomen and pelvis was performed
using the standard protocol following bolus administration of
intravenous contrast.
CONTRAST:  125mL OMNIPAQUE IOHEXOL 300 MG/ML  SOLN

[Series 2: abd/pelvis with · axial · 0.90mm/px · z∈[-438,-28]mm · 14 of 94 slices shown, 16 images]
[im 6/94  soft-tissue]
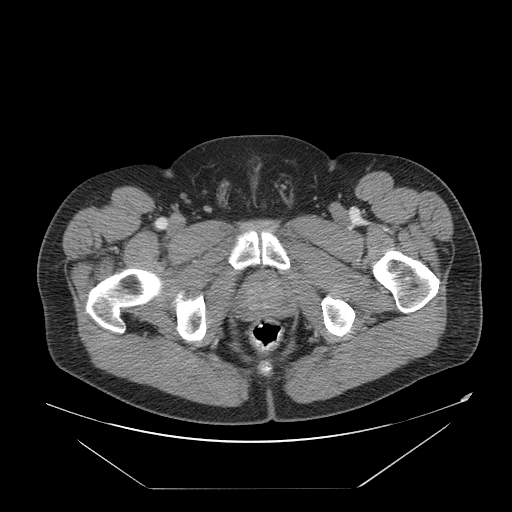
[im 6/94  bone]
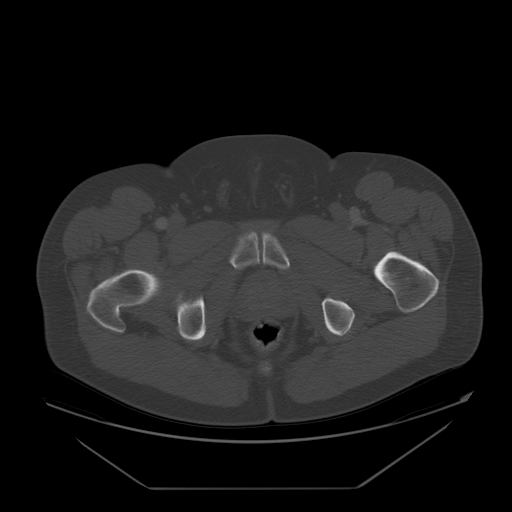
[im 11/94  soft-tissue]
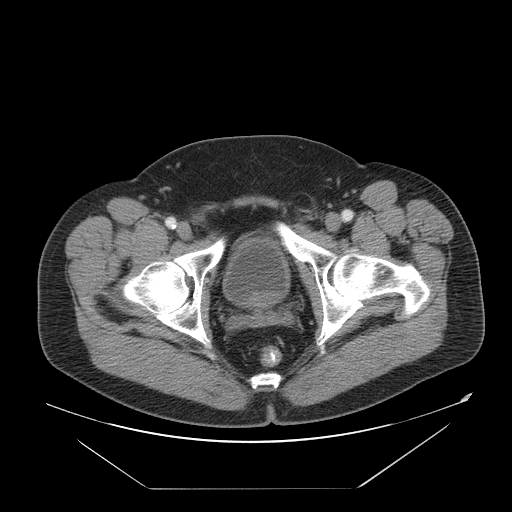
[im 21/94  soft-tissue]
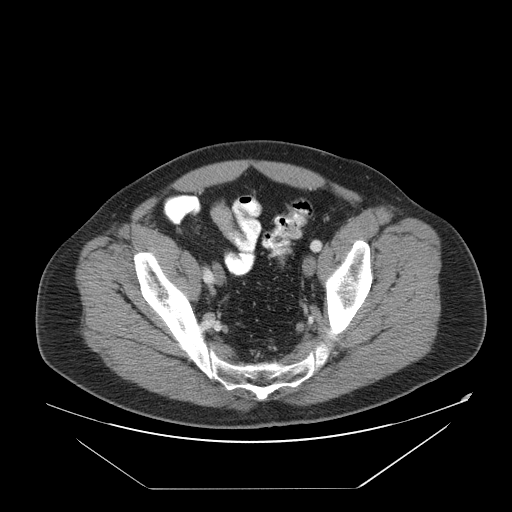
[im 26/94  soft-tissue]
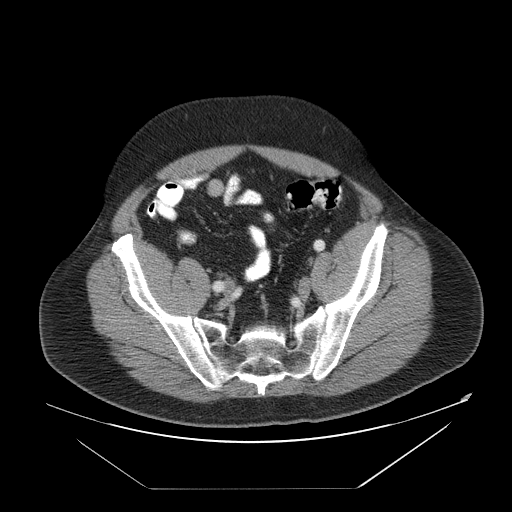
[im 32/94  soft-tissue]
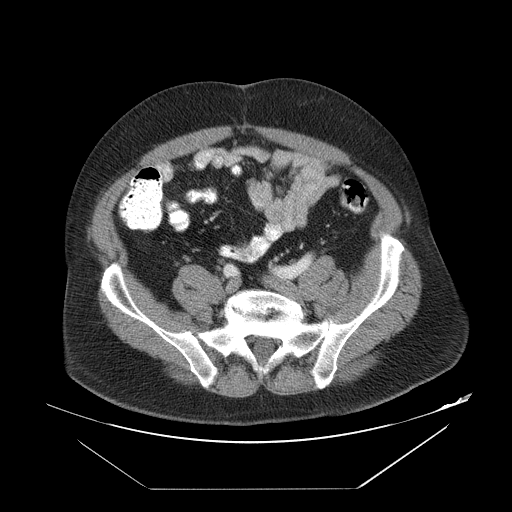
[im 37/94  soft-tissue]
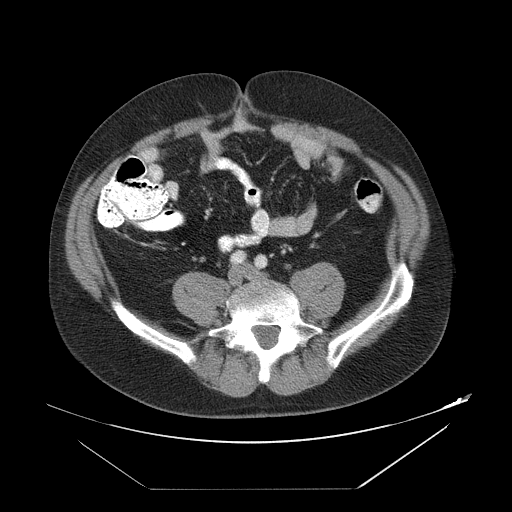
[im 42/94  soft-tissue]
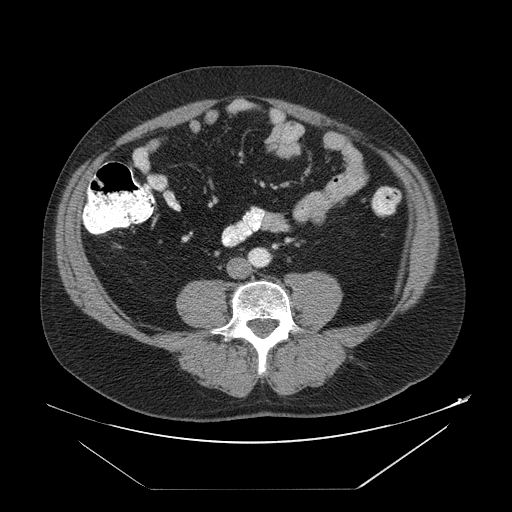
[im 52/94  soft-tissue]
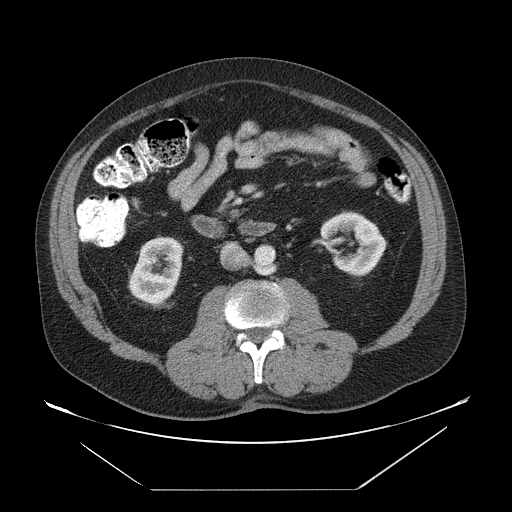
[im 57/94  soft-tissue]
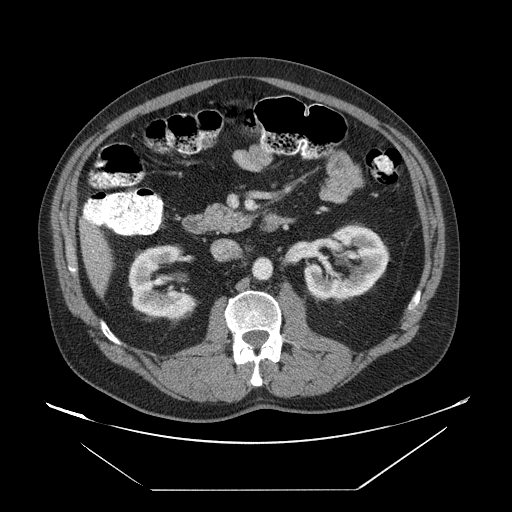
[im 57/94  bone]
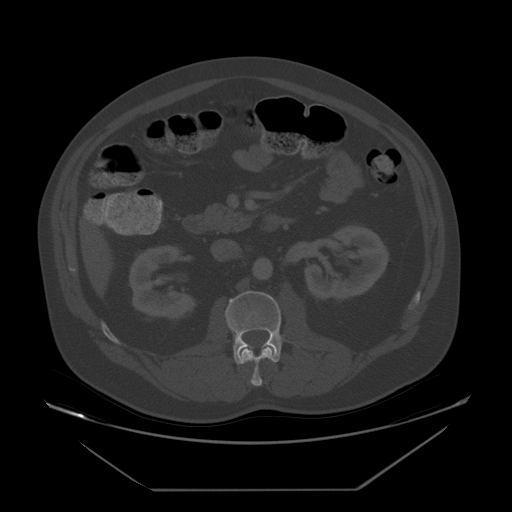
[im 63/94  soft-tissue]
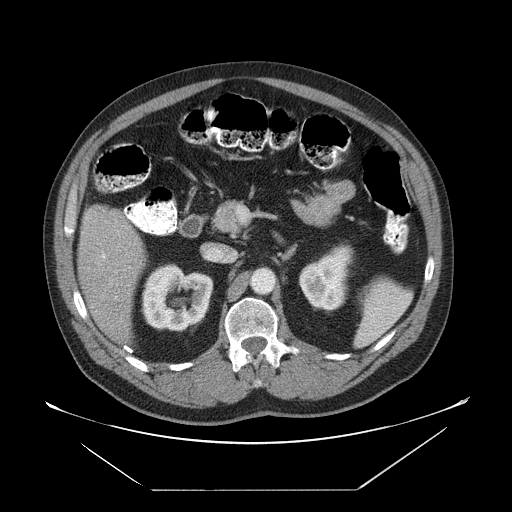
[im 68/94  soft-tissue]
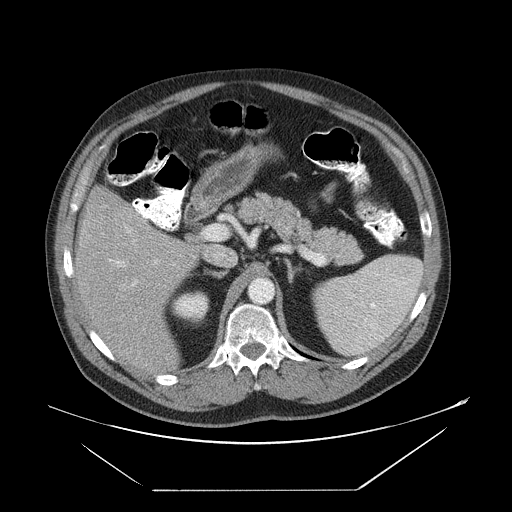
[im 73/94  soft-tissue]
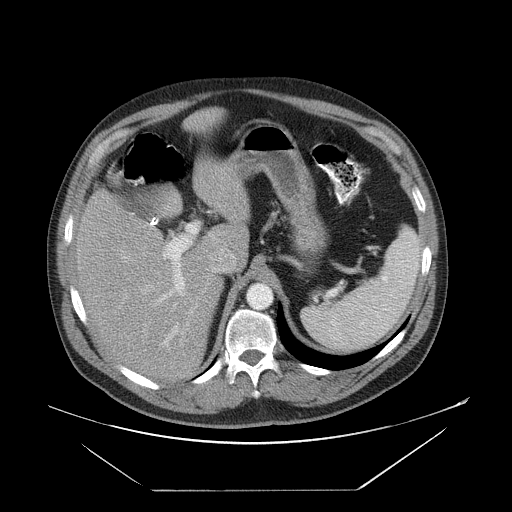
[im 83/94  soft-tissue]
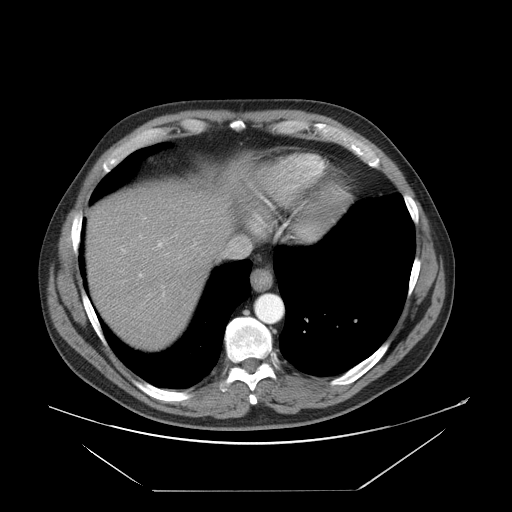
[im 88/94  soft-tissue]
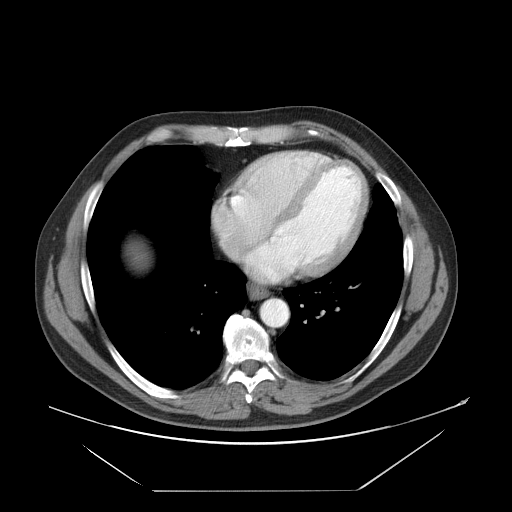

[Series 401: cor · coronal · 0.96mm/px · 3 of 140 slices shown]
[im 47/140  soft-tissue]
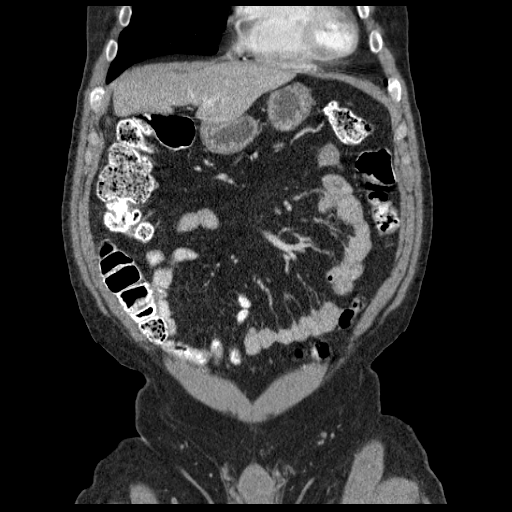
[im 62/140  soft-tissue]
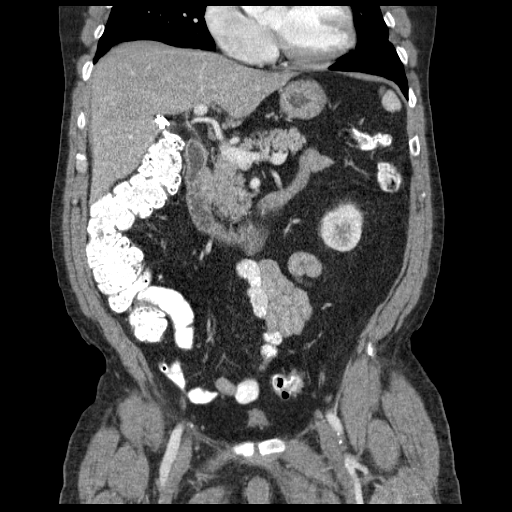
[im 78/140  soft-tissue]
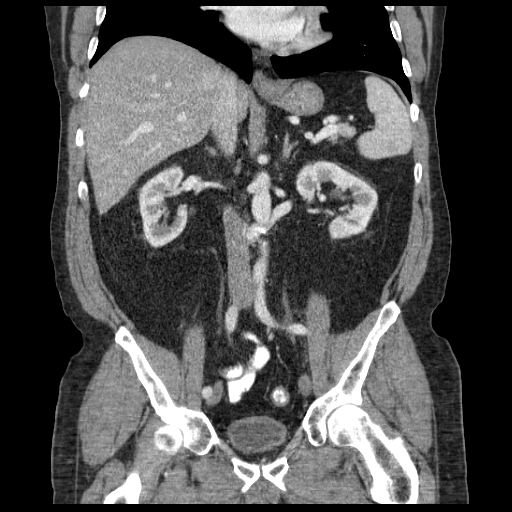

[17 of 46 positions shown; findings below may reference images not displayed]

FINDINGS: The gallbladder has been removed in the interval. There is minimal
lucent soft tissue in the gallbladder bed which is a normal
postoperative appearance. There are no dilated bile ducts. Liver
parenchyma is normal.

Spleen, pancreas, adrenal glands, and kidneys are normal. The bowel
is normal including the terminal ileum and appendix. No free air
free fluid. Bladder is normal.

Incidental note is made of a small urachal remnant which tethers the
dome of the bladder to the umbilicus. There is slight scarring at
the umbilicus from prior surgery. An underlying loop of small bowel
extends into the umbilical bulge.

No acute osseous abnormality.
IMPRESSION: 1. No change since the prior study except for surgical absence of
the gallbladder and slight surgical scarring at the umbilicus. No
complicating features are identified.
1. Urachal remnant tethers the dome of the bladder to the umbilicus.
There is slight scarring at the umbilicus consistent with the prior
surgery.

## 2014-04-06 ENCOUNTER — Ambulatory Visit (INDEPENDENT_AMBULATORY_CARE_PROVIDER_SITE_OTHER): Payer: BC Managed Care – PPO | Admitting: Nurse Practitioner

## 2014-04-06 ENCOUNTER — Encounter: Payer: Self-pay | Admitting: Nurse Practitioner

## 2014-04-06 VITALS — BP 127/69 | HR 64 | Ht 67.75 in | Wt 219.0 lb

## 2014-04-06 DIAGNOSIS — R259 Unspecified abnormal involuntary movements: Secondary | ICD-10-CM

## 2014-04-06 MED ORDER — CLONAZEPAM 2 MG PO TABS: 2.0000 mg | ORAL_TABLET | Freq: Three times a day (TID) | ORAL | Status: DC

## 2014-04-06 NOTE — Progress Notes (Signed)
[facility]  PATIENT: Dennis Woodard. 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 He has tried Botox in the past with little benefit. He did well on a combination of Depakote and Klonopin however he lost his insurance benefits and can no longer afford the Depakote. He has continued on the Klonopin. His facial spasms are worse with stressful situations. He also has a history of depression and anxiety and was treated by Dr. Clovis Pu. He has continued to gain weight and gets  little exercise He also complain of intermittent joint pain and low back pain. She returns for reevaluation  REVIEW OF SYSTEMS: Full 14 system review of systems performed and notable only for those listed, all others are neg:  Constitutional: Weight gain Cardiovascular: N/A  Ear/Nose/Throat: N/A  Skin: N/A  Eyes: N/A  Respiratory: N/A  Gastroitestinal: Urinary frequency  Hematology/Lymphatic: N/A  Endocrine: N/A Musculoskeletal:N/A  Allergy/Immunology: Allergies Neurological: No headache Psychiatric: Depression, anxiety Sleep insomnia  ALLERGIES: No Known Allergies  HOME MEDICATIONS: Outpatient Prescriptions Prior to Visit  Medication Sig Dispense Refill  . clonazePAM (KLONOPIN) 2 MG tablet Take 1 tablet (2 mg total) by mouth 3 (three) times daily. Facial twitch Scheduled doses  90 tablet  5  . buPROPion (WELLBUTRIN XL) 150 MG 24 hr tablet Take 150 mg by mouth daily.        No facility-administered medications prior to visit.    PAST MEDICAL HISTORY: Past Medical History  Diagnosis Date  . Fatty liver   . History of colon polyps   . Alcohol abuse   . Anxiety   . Skin cancer, basal cell   . Sleep apnea   . Psoriasis   . History of colon polyps   . Gallstones   . Nocturia   . Depression       takes Wellbutrin,Clonazepam,and Lexapro daily  . Insomnia     takes Trazodone nightly  . Movement disorder     PAST SURGICAL HISTORY: Past Surgical History  Procedure Laterality Date  . Wisdom tooth extraction    . Knee arthrsocopy      rt x 2 and left 1  . Colonoscopy    . Cholecystectomy  11/16/2012    Procedure: LAPAROSCOPIC CHOLECYSTECTOMY WITH INTRAOPERATIVE CHOLANGIOGRAM;  Surgeon: Joyice Faster. Cornett, MD;  Location: [facility];  Service: General;  Laterality: N/A;    FAMILY HISTORY: Family History  Problem Relation Age of Onset  . Colon cancer Neg Hx   . Dementia Mother     77    SOCIAL HISTORY: History   Social History  . Marital Status: Divorced    Spouse Name: N/A    Number of Children: 58  . Years of Education: 14   Occupational History  . [employer] History Main Topics  . Smoking status: Former Smoker    Types: Cigarettes    Quit date: 10/12/1970  . Smokeless tobacco: Never Used  . Alcohol Use: No     Comment: quit 32 yrs ago  . Drug Use: No  . Sexual Activity: Not on file   Other Topics Concern  . Not on file   Social History Narrative   Patient is divorced with 4 children   Patient is right handed   Patient has 2 yrs of  college   Patient drinks 3 cups daily     PHYSICAL EXAM  Filed Vitals:   04/06/14 0955 04/06/14 1002  BP: 152/66 127/69  Pulse: 69 64  Height: 5' 7.75" (1.721 m)   Weight: 219 lb (99.338 kg)    Body mass index is 33.54 kg/(m^2). General: well developed, well nourished, seated, in no evident distress  Head: head normocephalic and atraumatic. Oropharynx benign  Neck: supple with no carotid bruits  Cardiovascular: regular rate and rhythm, no murmurs  Neurologic Exam  Mental Status: Awake and fully alert. Oriented to place and time. Follows all commands. Speech and language normal.  Cranial Nerves:  Pupils equal, briskly reactive to light. Extraocular movements full without nystagmus. Visual  fields full to confrontation. Hearing intact and symmetric to finger snap. Facial sensation intact. Face, tongue, palate move normally and symmetrically. Neck flexion and extension normal. Very intermittent left hemi- facial spasm seen Motor: Normal bulk and tone. Normal strength in all tested extremity muscles.No focal weakness. No outstretched tremor  Sensory.: intact to touch and pinprick and vibratory.  Coordination: Rapid alternating movements normal in all extremities. Finger-to-nose and heel-to-shin performed accurately bilaterally.  Gait and Station: Arises from chair without difficulty. Stance is normal. Gait demonstrates normal stride length and balance . Able to heel, toe and tandem walk without difficulty.  Reflexes: Brachioradialis 2/2, biceps 2/2, triceps 2/2, patellar 2/2, Achilles 2/2, plantar responses were flexor bilaterally. DIAGNOSTIC DATA (LABS, IMAGING, TESTING) - ASSESSMENT AND PLAN  68 y.o. year old male  has a past medical history of Fatty liver; History of colon polyps; Alcohol abuse; Anxiety;  Sleep apnea; Psoriasis;  Nocturia; Depression; Insomnia; and Movement disorder. here  to followup for  facial spasms.   Continue clonazepam for left hemi facial spasm Prescription to patient  follow up yearly and when necessary Dennie Bible, [facility], [facility], [facility] [address], [place],  [address] [phone]

## 2014-04-06 NOTE — Patient Instructions (Signed)
Continue clonazepam for left hemi facial spasm Prescription to patient  follow up yearly and when necessary

## 2014-04-09 NOTE — Progress Notes (Signed)
I agree with the assessment and plan as directed by NP .The patient is known to me .   Adelbert Gaspard, MD  

## 2014-05-31 ENCOUNTER — Telehealth: Payer: Self-pay | Admitting: Nurse Practitioner

## 2014-05-31 NOTE — Telephone Encounter (Signed)
Patient stated Pharmacy need prior authorization for clonazePAM (KLONOPIN) 2 MG tablet.  Please call and advise.

## 2014-05-31 NOTE — Telephone Encounter (Signed)
A 6 month Rx was sent to the pharmacy in June.  I called the pharmacy, they said patient's Rx was in his file on hold.  They will fill it today and contact the patient when it's ready for pick up.  They indicate no prior auth is required.  They state the patient keeps trying to call in a refill for an old Rx number.

## 2014-08-29 ENCOUNTER — Encounter: Payer: Self-pay | Admitting: Neurology

## 2014-09-04 ENCOUNTER — Encounter: Payer: Self-pay | Admitting: Neurology

## 2014-09-05 ENCOUNTER — Encounter: Payer: Self-pay | Admitting: Neurology

## 2014-10-23 ENCOUNTER — Other Ambulatory Visit: Payer: Self-pay

## 2014-10-23 MED ORDER — CLONAZEPAM 2 MG PO TABS: 2.0000 mg | ORAL_TABLET | Freq: Three times a day (TID) | ORAL | Status: DC

## 2014-10-23 NOTE — Telephone Encounter (Signed)
Rx signed and faxed.

## 2015-04-10 ENCOUNTER — Encounter: Payer: Self-pay | Admitting: Nurse Practitioner

## 2015-04-10 ENCOUNTER — Ambulatory Visit: Payer: BC Managed Care – PPO | Admitting: Nurse Practitioner

## 2015-04-10 ENCOUNTER — Ambulatory Visit (INDEPENDENT_AMBULATORY_CARE_PROVIDER_SITE_OTHER): Payer: BC Managed Care – PPO | Admitting: Nurse Practitioner

## 2015-04-10 VITALS — BP 121/67 | HR 63 | Ht 68.0 in | Wt 216.0 lb

## 2015-04-10 DIAGNOSIS — R259 Unspecified abnormal involuntary movements: Secondary | ICD-10-CM | POA: Diagnosis not present

## 2015-04-10 MED ORDER — CLONAZEPAM 2 MG PO TABS: 2.0000 mg | ORAL_TABLET | Freq: Three times a day (TID) | ORAL | Status: DC

## 2015-04-10 NOTE — Patient Instructions (Signed)
Continue clonazepam for left hemifacial spasm will refill Follow-up yearly and when necessary Next visit with Dr. Brett Fairy

## 2015-04-10 NOTE — Progress Notes (Addendum)
[facility]  PATIENT: Dennis Woodard. 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 He has tried Botox in the past with little benefit. He did well on a combination of Depakote and Klonopin however he lost his insurance benefits and can no longer afford the Depakote. He has continued on the Klonopin. His facial spasms are worse with stressful situations. He also has a history of depression and anxiety and was treated by Dr. Clovis Pu at one time. 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 He returns for reevaluation   REVIEW OF SYSTEMS: Full 14 system review of systems performed and notable only for those listed, all others are neg:  Constitutional: neg  Cardiovascular: neg Ear/Nose/Throat: neg  Skin: neg Eyes: neg Respiratory: neg Gastroitestinal: neg  Hematology/Lymphatic: neg  Endocrine: neg Musculoskeletal:neg Allergy/Immunology: neg Neurological: occasional headache Psychiatric: neg Sleep : obstructive sleep apnea, does not use CPAP   ALLERGIES: No Known Allergies  HOME MEDICATIONS: Outpatient Prescriptions Prior to Visit  Medication Sig Dispense Refill  . clonazePAM (KLONOPIN) 2 MG tablet Take 1 tablet (2 mg total) by mouth 3 (three) times daily. Facial twitch Scheduled doses 90 tablet 5  . traZODone (DESYREL) 150 MG tablet Take 150 mg by mouth at bedtime. Taking 30 min before bedtime daily     No facility-administered medications prior to visit.    PAST MEDICAL HISTORY: Past Medical History  Diagnosis Date  . Fatty liver   . History of colon polyps   . Alcohol abuse   . Anxiety   . Skin cancer, basal cell   . Sleep apnea   . Psoriasis   . History of colon polyps     . Gallstones   . Nocturia   . Depression     takes Wellbutrin,Clonazepam,and Lexapro daily  . Insomnia     takes Trazodone nightly  . Movement disorder     PAST SURGICAL HISTORY: Past Surgical History  Procedure Laterality Date  . Wisdom tooth extraction    . Knee arthrsocopy      rt x 2 and left 1  . Colonoscopy    . Cholecystectomy  11/16/2012    Procedure: LAPAROSCOPIC CHOLECYSTECTOMY WITH INTRAOPERATIVE CHOLANGIOGRAM;  Surgeon: Dennis Faster. Cornett, MD;  Location: [facility];  Service: General;  Laterality: N/A;    FAMILY HISTORY: Family History  Problem Relation Age of Onset  . Colon cancer Neg Hx   . Dementia Mother     18    SOCIAL HISTORY: History   Social History  . Marital Status: Divorced    Spouse Name: N/A  . Number of Children: 4  . Years of Education: 14   Occupational History  . [employer] History Main Topics  . Smoking status: Former Smoker    Types: Cigarettes    Quit date: 10/12/1970  . Smokeless tobacco: Never Used  . Alcohol Use: No     Comment: quit 32 yrs ago  . Drug Use: No  . Sexual Activity: Not on file   Other Topics Concern  . Not on file   Social History Narrative   Patient is divorced with 4 children   Patient is right handed   Patient has 2  yrs of college   Patient drinks 3 cups daily     PHYSICAL EXAM  Filed Vitals:   04/10/15 1316  BP: 121/67  Pulse: 63  Height: 5\' 8"  (1.727 m)  Weight: 216 lb (97.977 kg)   Body mass index is 32.85 kg/(m^2). General: well developed, well nourished, obese male seated, in no evident distress  Head: head normocephalic and atraumatic. Oropharynx benign  Neck: supple with no carotid bruits  Cardiovascular: regular rate and rhythm, no murmurs  Neurologic Exam  Mental Status: Awake and fully alert. Oriented to place and time. Follows all commands. Speech and language normal.  Cranial Nerves: Pupils equal, briskly reactive to light. Extraocular  movements full without nystagmus. Visual fields full to confrontation. Hearing intact and symmetric to finger snap. Facial sensation intact. Face, tongue, palate move normally and symmetrically. Neck flexion and extension normal. Very intermittent left hemi- facial spasm seen Motor: Normal bulk and tone. Normal strength in all tested extremity muscles.No focal weakness. No outstretched tremor  Sensory.: intact to touch and pinprick and vibratory.  Coordination: Rapid alternating movements normal in all extremities. Finger-to-nose and heel-to-shin performed accurately bilaterally.  Gait and Station: Arises from chair without difficulty. Stance is normal. Gait demonstrates normal stride length and balance . Able to heel, toe and tandem walk without difficulty.  Reflexes: Brachioradialis 2/2, biceps 2/2, triceps 2/2, patellar 2/2, Achilles 2/2, plantar responses were flexor bilaterally.  DIAGNOSTIC DATA (LABS, IMAGING, TESTING) -  ASSESSMENT AND PLAN  69 y.o. year old male  has a past medical history of Fatty liver;  Alcohol abuse; Anxiety;  Sleep apnea;  Movement disorder left hemifacial spasm here to follow-up. His left hemifacial spasm is well controlled on Klonopin. The patient is a current patient of Dr. Brett Woodard  who is out of the office today . This note is sent to the work in doctor.     Continue clonazepam for left hemifacial spasm will refill Follow-up yearly and when necessary Next visit with Dr. Olene Floss Dennis Woodard, [facility], [facility], APRN  [facility] [address] [place], [place] [address] [phone]  Personally participated in and made any corrections needed to history, physical, neuro exam,assessment and plan and agree as stated above.  Dennis Ill, MD [facility]

## 2015-05-16 ENCOUNTER — Other Ambulatory Visit: Payer: Self-pay | Admitting: Neurology

## 2015-05-17 ENCOUNTER — Telehealth: Payer: Self-pay | Admitting: Nurse Practitioner

## 2015-05-17 NOTE — Telephone Encounter (Signed)
6 month Rx was sent on 06/29

## 2015-05-17 NOTE — Telephone Encounter (Signed)
I called the pharmacy to clarify.  Spoke with pharmacist who said they have the Rx on file, but it is too soon to refill at this time.  Patient got a 30 day Rx on 07/10, so they will not fill it again until 08/08.  I called the patient back.  Got no answer.  Left message relaying info provided by pharmacy.

## 2015-05-17 NOTE — Telephone Encounter (Signed)
Pt called and says that when he went to get refill on clonazePAM (KLONOPIN) 2 MG tablet the pharmacy said that the medication needed authorization. Would you know why? He has never had this problem before. Please call and advise [phone]

## 2015-05-21 ENCOUNTER — Other Ambulatory Visit: Payer: Self-pay | Admitting: Neurology

## 2015-05-21 NOTE — Telephone Encounter (Signed)
Spoke with pharmacist who verified they do have the Rx that was written on 06/29.  Asked that we disregard this request

## 2015-08-01 ENCOUNTER — Telehealth: Payer: Self-pay | Admitting: Neurology

## 2015-08-01 NOTE — Telephone Encounter (Signed)
Patient called to request refill traZODone (DESYREL) 150 MG tablet. [facility] said that [facility] to [place] x [facility] and we are not responding (main fax machine has been down).

## 2015-08-01 NOTE — Telephone Encounter (Signed)
It does not appear we have prescribed this drug for the patient in Epic.  I called the pharmacy to clarify.  Spoke with Lanelle Bal.  She said this Rx is written by Dr Shelia Media, and that is who they are waiting to respond.  I called the patient back.  Got no answer.  Left message relaying info provided by pharmacy.

## 2015-10-29 ENCOUNTER — Telehealth: Payer: Self-pay | Admitting: Nurse Practitioner

## 2015-10-29 ENCOUNTER — Other Ambulatory Visit: Payer: Self-pay | Admitting: Nurse Practitioner

## 2015-10-29 NOTE — Telephone Encounter (Signed)
Patient called to request refill of clonazePAM (KLONOPIN) 2 MG tablet

## 2015-10-30 NOTE — Telephone Encounter (Signed)
I was out of the office yesterday.  It appears this was already taken care of.

## 2016-04-09 ENCOUNTER — Ambulatory Visit: Payer: Medicare Other | Admitting: Neurology

## 2016-04-20 ENCOUNTER — Other Ambulatory Visit: Payer: Self-pay | Admitting: Nurse Practitioner

## 2016-04-20 ENCOUNTER — Telehealth: Payer: Self-pay

## 2016-04-20 MED ORDER — CLONAZEPAM 2 MG PO TABS: 2.0000 mg | ORAL_TABLET | Freq: Three times a day (TID) | ORAL | Status: DC

## 2016-04-20 NOTE — Telephone Encounter (Signed)
Patient returned call regarding our office needing to change his July 14th appointment, appointment moved to July 21st. Patient requests refill and authorization of clonazePAM (KLONOPIN) 2 MG tablet.

## 2016-04-20 NOTE — Telephone Encounter (Signed)
Received fax confirmation [phone]

## 2016-04-20 NOTE — Telephone Encounter (Signed)
LM that Dennis Woodard with be out of the office on Monday and to call us back to reschedule. I advised that I will cancel appt on Friday and he just needs to call and reschedule. I will also send the patient a letter.

## 2016-04-24 ENCOUNTER — Ambulatory Visit: Payer: Medicare Other | Admitting: Nurse Practitioner

## 2016-05-01 ENCOUNTER — Ambulatory Visit (INDEPENDENT_AMBULATORY_CARE_PROVIDER_SITE_OTHER): Payer: BC Managed Care – PPO | Admitting: Nurse Practitioner

## 2016-05-01 ENCOUNTER — Encounter: Payer: Self-pay | Admitting: Nurse Practitioner

## 2016-05-01 VITALS — BP 121/58 | HR 56 | Ht 68.0 in | Wt 205.4 lb

## 2016-05-01 DIAGNOSIS — R259 Unspecified abnormal involuntary movements: Secondary | ICD-10-CM | POA: Diagnosis not present

## 2016-05-01 NOTE — Progress Notes (Signed)
[facility]  PATIENT: Dennis Woodard. 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 He has tried Botox in the past with little benefit. He did well on a combination of Depakote and Klonopin however he lost his insurance benefits and can no longer afford the Depakote. He has continued on the Klonopin. His facial spasms are worse with stressful situations. He also has a history of depression and anxiety and was treated by psych  at one time. He returns for reevaluation   REVIEW OF SYSTEMS: Full 14 system review of systems performed and notable only for those listed, all others are neg:  Constitutional: neg  Cardiovascular: neg Ear/Nose/Throat: neg  Skin: neg Eyes: neg Respiratory: neg Gastroitestinal: neg  Hematology/Lymphatic: neg  Endocrine: neg Musculoskeletal:neg Allergy/Immunology: neg Neurological: neg Psychiatric: neg Sleep : neg   ALLERGIES: No Known Allergies  HOME MEDICATIONS: Outpatient Prescriptions Prior to Visit  Medication Sig Dispense Refill  . clonazePAM (KLONOPIN) 2 MG tablet Take 1 tablet (2 mg total) by mouth 3 (three) times daily. 90 tablet 1  . traZODone (DESYREL) 150 MG tablet Take 150 mg by mouth at bedtime. Taking 30 min before bedtime daily     No facility-administered medications prior to visit.    PAST MEDICAL HISTORY: Past Medical History  Diagnosis Date  . Fatty liver   . History of colon polyps   . Alcohol abuse   . Anxiety   . Skin cancer, basal cell   . Sleep apnea   . Psoriasis   . History of colon polyps   . Gallstones   . Nocturia   . Depression     takes Wellbutrin,Clonazepam,and Lexapro daily  . Insomnia     takes Trazodone nightly  . Movement disorder     PAST SURGICAL HISTORY: Past  Surgical History  Procedure Laterality Date  . Wisdom tooth extraction    . Knee arthrsocopy      rt x 2 and left 1  . Colonoscopy    . Cholecystectomy  11/16/2012    Procedure: LAPAROSCOPIC CHOLECYSTECTOMY WITH INTRAOPERATIVE CHOLANGIOGRAM;  Surgeon: Joyice Faster. Cornett, MD;  Location: [facility];  Service: General;  Laterality: N/A;    FAMILY HISTORY: Family History  Problem Relation Age of Onset  . Colon cancer Neg Hx   . Dementia Mother     68    SOCIAL HISTORY: Social History   Social History  . Marital Status: Divorced    Spouse Name: N/A  . Number of Children: 4  . Years of Education: 14   Occupational History  . [employer] History Main Topics  . Smoking status: Former Smoker    Types: Cigarettes    Quit date: 10/12/1970  . Smokeless tobacco: Never Used  . Alcohol Use: No     Comment: quit 32 yrs ago  . Drug Use: No  . Sexual Activity: Not on file   Other Topics Concern  . Not on file   Social History Narrative   Patient is divorced with 4 children   Patient is right handed   Patient has 2 yrs of college   Patient drinks 3 cups daily     PHYSICAL EXAM  Filed Vitals:   05/01/16 1126  BP: 121/58  Pulse: 56  Height: 5\' 8"  (1.727 m)  Weight: 205 lb 6.4 oz (93.169 kg)   Body mass index is 31.24 kg/(m^2). General: well developed, well nourished, obese male seated, in no evident distress  Head: head normocephalic and atraumatic. Oropharynx benign  Neck: supple with no carotid bruits  Cardiovascular: regular rate and rhythm, no murmurs  Neurologic Exam  Mental Status: Awake and fully alert. Oriented to place and time. Follows all commands. Speech and language normal.  Cranial Nerves: Pupils equal, briskly reactive to light. Extraocular movements full without nystagmus. Visual fields full to confrontation. Hearing intact and symmetric to finger snap. Facial sensation intact. Face, tongue, palate move normally and  symmetrically. Neck flexion and extension normal. Very intermittent left hemi- facial spasm seen Motor: Normal bulk and tone. Normal strength in all tested extremity muscles.No focal weakness. No outstretched tremor  Sensory.: intact to touch and pinprick and vibratory.  Coordination: Rapid alternating movements normal in all extremities. Finger-to-nose and heel-to-shin performed accurately bilaterally.  Gait and Station: Arises from chair without difficulty. Stance is normal. Gait demonstrates normal stride length and balance . Able to heel, toe and tandem walk without difficulty.  Reflexes: Symmetric upper and lower, plantar responses were flexor bilaterally. DIAGNOSTIC DATA (LABS, IMAGING, TESTING)   ASSESSMENT AND PLAN 70 y.o. year old male has a past medical history of Movement disorder left hemifacial spasm here to follow-up. His left hemifacial spasm is well controlled on Klonopin.   Continue clonazepam for left hemifacial spasm just refilled  Follow-up yearly and when necessary, next with Dr. Brett Fairy Dennie Bible, [facility], [facility], [facility] [address], [place], [place] [address] [phone]

## 2016-05-01 NOTE — Patient Instructions (Signed)
Continue clonazepam for left hemifacial spasm  Follow-up yearly and when necessary, next with Dr. Brett Fairy

## 2016-05-04 NOTE — Progress Notes (Signed)
I agree with the assessment and plan as directed by NP .The patient is known to me .   Landi Biscardi, MD  

## 2016-06-18 ENCOUNTER — Other Ambulatory Visit: Payer: Self-pay | Admitting: Nurse Practitioner

## 2016-06-18 MED ORDER — CLONAZEPAM 2 MG PO TABS: 2.0000 mg | ORAL_TABLET | Freq: Three times a day (TID) | ORAL | 1 refills | Status: DC

## 2016-06-18 NOTE — Telephone Encounter (Signed)
Pt called said he will run out of the medication tomorrow. [facility] Pt is concerned about the upcoming weather and that the directions sts to not stop suddenly. He is requesting RX to be called asap. Pt was advised to call 3-4 days in advance for future refills.

## 2016-06-18 NOTE — Telephone Encounter (Signed)
Patient called to request refill of clonazePAM (KLONOPIN) 2 MG tablet

## 2016-06-19 NOTE — Telephone Encounter (Signed)
Fax confirmation received for [phone] clonazepam.

## 2016-06-19 NOTE — Telephone Encounter (Signed)
LMVM for pt that [facility].

## 2016-07-23 ENCOUNTER — Encounter: Payer: Self-pay | Admitting: Gastroenterology

## 2016-08-18 ENCOUNTER — Other Ambulatory Visit: Payer: Self-pay | Admitting: Nurse Practitioner

## 2016-08-18 NOTE — Telephone Encounter (Signed)
Fax confirmation received for clonazepam [phone]. ssy

## 2016-09-17 ENCOUNTER — Other Ambulatory Visit: Payer: Self-pay | Admitting: Nurse Practitioner

## 2016-10-12 DEATH — deceased

## 2017-05-03 ENCOUNTER — Ambulatory Visit: Payer: BC Managed Care – PPO | Admitting: Neurology

## 2017-05-04 ENCOUNTER — Encounter: Payer: Self-pay | Admitting: Neurology
